# Patient Record
Sex: Female | Born: 1950
Health system: Southern US, Community
[De-identification: ages and names within clinical notes are randomized; demographics above are authoritative.]

## PROBLEM LIST (undated history)

## (undated) DIAGNOSIS — K8020 Calculus of gallbladder without cholecystitis without obstruction: Secondary | ICD-10-CM

## (undated) DIAGNOSIS — I10 Essential (primary) hypertension: Secondary | ICD-10-CM

## (undated) DIAGNOSIS — Z79890 Hormone replacement therapy (postmenopausal): Secondary | ICD-10-CM

## (undated) DIAGNOSIS — E785 Hyperlipidemia, unspecified: Secondary | ICD-10-CM

## (undated) DIAGNOSIS — K635 Polyp of colon: Secondary | ICD-10-CM

## (undated) DIAGNOSIS — K219 Gastro-esophageal reflux disease without esophagitis: Secondary | ICD-10-CM

## (undated) HISTORY — DX: Calculus of gallbladder without cholecystitis without obstruction: K80.20

## (undated) HISTORY — DX: Gastro-esophageal reflux disease without esophagitis: K21.9

## (undated) HISTORY — DX: Hormone replacement therapy: Z79.890

## (undated) HISTORY — DX: Essential (primary) hypertension: I10

## (undated) HISTORY — PX: TOTAL ABDOMINAL HYSTERECTOMY: SHX209

## (undated) HISTORY — PX: TUBAL LIGATION: SHX77

## (undated) HISTORY — PX: SHOULDER ARTHROSCOPY: SHX128

## (undated) HISTORY — DX: Hyperlipidemia, unspecified: E78.5

## (undated) HISTORY — PX: FRACTURE SURGERY: SHX138

## (undated) HISTORY — PX: TONSILLECTOMY: SUR1361

## (undated) HISTORY — DX: Polyp of colon: K63.5

---

## 1999-12-23 ENCOUNTER — Encounter: Admission: RE | Admit: 1999-12-23 | Discharge: 1999-12-23 | Payer: Self-pay | Admitting: Obstetrics and Gynecology

## 1999-12-23 ENCOUNTER — Encounter: Payer: Self-pay | Admitting: Obstetrics and Gynecology

## 2000-12-27 ENCOUNTER — Encounter: Payer: Self-pay | Admitting: Obstetrics and Gynecology

## 2000-12-27 ENCOUNTER — Encounter: Admission: RE | Admit: 2000-12-27 | Discharge: 2000-12-27 | Payer: Self-pay | Admitting: Obstetrics and Gynecology

## 2001-12-28 ENCOUNTER — Encounter: Admission: RE | Admit: 2001-12-28 | Discharge: 2001-12-28 | Payer: Self-pay | Admitting: Obstetrics and Gynecology

## 2001-12-28 ENCOUNTER — Encounter: Payer: Self-pay | Admitting: Obstetrics and Gynecology

## 2002-05-03 ENCOUNTER — Ambulatory Visit (HOSPITAL_BASED_OUTPATIENT_CLINIC_OR_DEPARTMENT_OTHER): Admission: RE | Admit: 2002-05-03 | Discharge: 2002-05-03 | Payer: Self-pay

## 2002-09-18 ENCOUNTER — Encounter: Payer: Self-pay | Admitting: Gastroenterology

## 2002-09-18 ENCOUNTER — Encounter: Admission: RE | Admit: 2002-09-18 | Discharge: 2002-09-18 | Payer: Self-pay | Admitting: Gastroenterology

## 2002-10-19 ENCOUNTER — Ambulatory Visit (HOSPITAL_COMMUNITY): Admission: RE | Admit: 2002-10-19 | Discharge: 2002-10-19 | Payer: Self-pay | Admitting: Gastroenterology

## 2002-10-19 ENCOUNTER — Encounter (INDEPENDENT_AMBULATORY_CARE_PROVIDER_SITE_OTHER): Payer: Self-pay | Admitting: Specialist

## 2002-12-31 ENCOUNTER — Encounter: Payer: Self-pay | Admitting: Obstetrics and Gynecology

## 2002-12-31 ENCOUNTER — Encounter: Admission: RE | Admit: 2002-12-31 | Discharge: 2002-12-31 | Payer: Self-pay | Admitting: Obstetrics and Gynecology

## 2004-01-10 ENCOUNTER — Encounter: Admission: RE | Admit: 2004-01-10 | Discharge: 2004-01-10 | Payer: Self-pay | Admitting: Obstetrics and Gynecology

## 2005-02-10 ENCOUNTER — Encounter: Admission: RE | Admit: 2005-02-10 | Discharge: 2005-02-10 | Payer: Self-pay | Admitting: Obstetrics and Gynecology

## 2006-02-14 ENCOUNTER — Encounter: Admission: RE | Admit: 2006-02-14 | Discharge: 2006-02-14 | Payer: Self-pay | Admitting: Obstetrics and Gynecology

## 2006-08-11 ENCOUNTER — Inpatient Hospital Stay (HOSPITAL_COMMUNITY): Admission: EM | Admit: 2006-08-11 | Discharge: 2006-08-14 | Payer: Self-pay | Admitting: Emergency Medicine

## 2007-05-25 HISTORY — PX: TIBIA FRACTURE SURGERY: SHX806

## 2007-11-29 ENCOUNTER — Ambulatory Visit: Payer: Self-pay | Admitting: Cardiology

## 2007-12-07 ENCOUNTER — Ambulatory Visit: Payer: Self-pay

## 2007-12-07 ENCOUNTER — Encounter: Payer: Self-pay | Admitting: Cardiology

## 2008-12-04 ENCOUNTER — Other Ambulatory Visit: Admission: RE | Admit: 2008-12-04 | Discharge: 2008-12-04 | Payer: Self-pay | Admitting: Obstetrics and Gynecology

## 2009-10-01 ENCOUNTER — Telehealth (INDEPENDENT_AMBULATORY_CARE_PROVIDER_SITE_OTHER): Payer: Self-pay | Admitting: *Deleted

## 2010-06-23 NOTE — Progress Notes (Signed)
  Recieved ROI Via Fax from pt, Faxed LOV,Stress,Echo.[email or web address] lead over to Dr.Nasher office  [facility]  Oct 01, 2009 9:28 AM

## 2010-10-06 NOTE — Assessment & Plan Note (Signed)
[facility]                            CARDIOLOGY OFFICE NOTE   [name], [name]                    MRN:          [id number]  DATE:11/29/2007                            DOB:          [birth year]    HISTORY:  [name] is a pleasant 60 year old female who I am asked  to evaluate for an abnormal echocardiogram and chest pain.  She has no  prior cardiac history.  Over the past 1 year she has had occasional  chest tightness and is described as a choking feeling.  It is in the  substernal/upper chest area.  It does not radiate.  It is not pleuritic,  positional, nor it is related to food.  It is not exertional.  There is  no associated nausea, vomiting, shortness of breath, or diaphoresis.  It  lasts typically several minutes although it has lasted up to an hour in  the past.  There is no relieving factors.  Of note, over the past 2  weeks this symptoms have almost been continuous.  Because of the above  we were asked to further evaluate.  There was also question of an  abnormal electrocardiogram at Dr. Lavon Paganini office.   PRESENT MEDICATIONS:  1. Fosteum daily.  2. Estradiol 0.5 mg p.o. b.i.d.  3. Multivitamin.  4. Glucosamine/chondroitin.  5. Caltrate.  6. Grape seed.   ALLERGIES:  She has no known drug allergies.   SOCIAL HISTORY:  She does not smoke, only rarely consumes alcohol.   FAMILY HISTORY:  Negative for coronary artery disease or sudden death.   PAST MEDICAL HISTORY:  Significant for hyperlipidemia, but there is no  diabetes mellitus or hypertension.  There is no other medical problems.  She has had a prior hysterectomy.  She fractured her left knee  approximately 1 year ago.  She has also had surgery on the rotator cuff  on the left.   REVIEW OF SYSTEMS:  She denies any headaches, fevers, or chills.  There  is no productive cough or hemoptysis.  There is no dysphagia,  odynophagia, melena, or hematochezia.  There is no  dysuria or hematuria.  There is no rash or seizure activity.  There is no orthopnea, PND, or  pedal edema.  Remaining systems are negative.   PHYSICAL EXAMINATION:  VITAL SIGNS:  Today shows a blood pressure of  128/83 and a pulse of 76.  She weighs 120 pounds.  GENERAL:  She is well developed, well nourished, in no acute distress.  She does not appear to be depressed.  There is no peripheral clubbing.  SKIN:  Warm and dry.  BACK:  Normal.  HEENT:  Normal.  Normal eyelids.  NECK:  Supple with a normal upstroke bilaterally.  No bruits heard.  There is no jugular venous distention, and I cannot appreciate  thyromegaly.  CHEST:  Clear to auscultation, normal expansion.  CARDIOVASCULAR:  Regular rate and rhythm.  Normal S1 and S2.  There are  no murmurs, rubs, or gallops noted.  ABDOMEN:  Nontender, distended.  Positive bowel sounds.  No  hepatosplenomegaly, no mass appreciated.  There  is no abdominal bruit.  EXTREMITIES:  She has 2+ femoral pulses bilaterally.  No bruits.  No  edema, palpate no cords.  She has 2+ posterior tibial pulses  bilaterally.  NEUROLOGIC:  Grossly intact.   Her electrocardiogram here shows a sinus rhythm at a rate of 62.  The  axis is normal.  There are no ST changes noted.  Note, I do have an  electrocardiogram from Dr. Lavon Paganini office dated November 21, 2007.  This  shows a sinus rhythm.  There is poor R-wave progression and prior septal  infarct cannot be excluded, although I think there is a component of  lead placement on this particular tracing.  Note the patient also had  laboratories drawn in Dr. Lavon Paganini office that showed an LDL of 156 and  an HDL of 67.   DIAGNOSES:  1. Atypical chest pain - [name]'s symptoms are somewhat      atypical and her electrocardiogram is normal.  We will plan to      proceed with the stress echocardiogram.  If it is normal, we will      not proceed further ischemia evaluation.  2. Abnormal electrocardiogram - I  have reviewed the electrocardiogram      from Dr. Lavon Paganini office and some of this appears to be related to      lead placement.  Her electrocardiogram here is normal.  We will      plan to do a stress echocardiogram described in #1.  3. Hyperlipidemia - She will need to follow a low-cholesterol diet and      follow up with Dr. Elana Alm concerning this issue.  4. History of knee fracture - This occurred approximately 1 year ago.      I considered pulmonary embolus as the cause of her symptoms.      However, her pain is not pleuritic and had been intermittent for      the past year.  I, therefore, doubt pulmonary embolus and will not      pursue this further.  We will see her back on an as-needed basis,      pending results of stress echocardiogram.     Madolyn Frieze. Jens Som, MD, [facility]  Electronically Signed    BSC/MedQ  DD: 11/29/2007  DT: 11/30/2007  Job #: [id number]   cc:   S. Kyra Manges, M.D.

## 2010-10-09 NOTE — Op Note (Signed)
   NAME:  [name], [name]                       ACCOUNT NO.:  [id number]   MEDICAL RECORD NO.:  [id number]                   PATIENT TYPE:  AMB   LOCATION:  ENDO                                 FACILITY:  [facility]   PHYSICIAN:  Anselmo Rod, M.D.               DATE OF BIRTH:  [birth year]   DATE OF PROCEDURE:  10/19/2002  DATE OF DISCHARGE:                                 OPERATIVE REPORT   PROCEDURE PERFORMED:  Colonoscopy with snare polypectomy x1.   ENDOSCOPIST:  Anselmo Rod, M.D.   INSTRUMENT USED:  Olympus videocolonoscope.   INDICATION FOR THE PROCEDURE:  A 60 year old white female with a history of  acute bleeding and change in bowel habits.  Rule out colonic polyps, masses,  hemorrhoids, etc.   PREPROCEDURE PREPARATION:  Informed consent was procured from the patient.  The patient had fasted for eight hours prior to the procedure and prepped  with a bottle of magnesium citrate and a gallon on GoLYTELY the night prior  to the procedure.   PREPROCEDURE PHYSICAL:  VITAL SIGNS:  Stable.  NECK:  Supple.  CHEST:  Clear to auscultation.  S1 and S2 regular.  ABDOMEN:  Soft with normal bowel sounds.   DESCRIPTION OF THE PROCEDURE:  The patient was placed in the left lateral  decubitus position and sedated with 6 mg of Demerol and  6 mg Versed  intravenously.  Once the patient was adequately sedated and maintained on  low-flow oxygen and continuous cardiac monitoring, the Olympus  videocolonoscope was advanced from the rectum to the cecum without  difficulty.  A small sessile polyp was snared from the proximal right colon.  The appendiceal orifice and ileocecal valve were clearly visualized.  The  terminal ileum appeared normal as well.  Retroflexion in the rectum revealed  small internal hemorrhoids.  No other masses or polyps were seen.  The  patient tolerated the procedure well without complications.   IMPRESSION:  1. Small nonbleeding internal hemorrhoids.  2. Small  sessile polyp snared from the proximal right colon.  3. Otherwise normal colonoscopy to the terminal ileum.   RECOMMENDATIONS:  1. Await pathology results.  2.     Avoid all nonsteroidals including aspirin for now.  3. High fiber diet with liberal fluid intake.  4. Outpatient followup in the next two weeks for further recommendations.                                               Anselmo Rod, M.D.    JNM/MEDQ  D:  10/19/2002  T:  10/20/2002  Job:  [id number]   cc:   Teena Irani. Arlyce Dice, M.D.  [address]  [place]  [place] [address]  Fax: [phone]

## 2010-10-09 NOTE — Op Note (Signed)
NAMEAMBIKA, ZETTLEMOYER             ACCOUNT NO.:  [id number]   MEDICAL RECORD NO.:  [id number]          PATIENT TYPE:  INP   LOCATION:  5030                         FACILITY:  [facility]   PHYSICIAN:  Doralee Albino. Carola Frost, M.D. DATE OF BIRTH:  1951/04/26   DATE OF PROCEDURE:  08/12/2006  DATE OF DISCHARGE:                               OPERATIVE REPORT   PREOPERATIVE DIAGNOSIS:  Left bicondylar tibial plateau and eminence  fracture.   POSTOPERATIVE DIAGNOSES:  1. Left bicondylar tibial plateau and eminence fracture.  2. Lateral meniscus detachment.   PROCEDURES:  1. Open reduction and internal fixation of left bicondylar tibial      plateau.  2. Repair of tibial eminence fracture.  3. Repair of lateral meniscus through an arthrotomy.  4. Anterior compartment fasciotomy.   SURGEON:  Myrene Galas, M.D.   ASSISTANT:  None.   ANESTHESIA:  General, Dr. Katrinka Blazing.   SPECIMENS:  None.   DRAINS:  One   COMPLICATIONS:  None.   TOURNIQUET:  None.   DISPOSITION:  PACU.   CONDITION:  Stable.   BRIEF SUMMARY AND INDICATIONS FOR PROCEDURE:  Normal Mans is a 60-  year-old female who sustained a severe left tibial plateau fracture in a  ground-level fall down some steps.  She did not have any sensory or  motor disturbances distally.  Underwent a period of spine mobilization  and compression, ice and elevation.  Soft tissues were rechecked and  found to wrinkle quite easily without excessive swelling and  consequently was deemed stable for internal fixation.  We discussed at  length with the patient and her family the risk of infection, nerve  injury, vessel injury, compartment syndrome, subsequent need for  fasciotomy, malunion, nonunion, arthritis, decreased range of motion,  instability and others.  Also we discussed DVT, PE  and after full  discussion of these risks and others, she wished to proceed.   BRIEF DESCRIPTION OF PROCEDURE:  Ms. Esther was taken to the  operating room  after administration of preop antibiotics.  Her left  lower extremity was prepped and draped in usual sterile fashion.  Tourniquet was placed about the leg but never inflated during the case.  We made a classic AO type approach with a curvilinear incision over  Gerty's tubercle.  Dissection was carried carefully down to the anterior  fascia which was split. A cuff of tissue was left over the lateral  tibial plateau.  We also incised the retinaculum proximal to the joint  and then performed an arthrotomy by incising along the base of the  coronary ligament.  As we reflected the retinaculum, complete detachment  of the lateral meniscus was visualized.  It was retracted medially.  Also impaction of the tibial plateau was visualized.  These were in  large segments fortunately and the cartilage itself appeared well-  preserved with no underlying significant arthritis.  We then applied a  series of Prolene sutures from posterior around the midbody to the  anterior portion of the meniscus in order to repair it in vertical  mattress fashion, but we did not repair it at that time  so that we could  have full visualization of the joint.  We then began to elevate the  articular surface using a series of tamps.  Once this was complete, we  placed multiple K-wires across the surface and then made a small stab  incision on the medial side for use of the sharp/sharp tenaculum to  correct some slight posterior shear of the medial block of the articular  surface.  After correction we then fixed the appropriate size 3-5  lateral plate and used the King tong clamp to achieve compression across  the articular surface.  We checked our plate position on AP and lateral  images and then placed a series of rafter screws and standard screws  distally to affix the plate to the bone followed by further lock  fixation.  The metaphyseal defect was then filled with 5 mL of Norian  cement.  Her bone quality was marginal.   The meniscus was then repaired  again using a free needle with a vertical mattress technique,  reattaching it to the capsule.  The retinaculum was repaired with 1-0  Vicryl.   Final AP and lateral images showed appropriate articular reduction,  overall alignment and hardware placement without any apparent  complication.  We did take the long soft tissue scissors and create a  path both on the superficial and deep aspect of the anterior compartment  fascia and then performed a prophylactic fasciotomy underneath the skin  all the way down to the ankle.  No bleeding of any kind was noted with  this and the scissors tip was turned medial away from the superficial  peroneal nerve.  The anterior compartment then had a medium Hemovac  drain placed and a layered closure without reapproximating the anterior  compartment was performed.  We did place a corner stitch from that  fascia to prevent retraction and we did try to close over the plate  proximally with the deep layer, but again the anterior compartment was  left open to allow for swelling.  Vicryl 2-0 and nylon was used for the  skin.  Sterile gently compressive dressing and the knee immobilizer was  applied.  The patient was taken to PACU in stable condition.   PROGNOSIS:  Ms. Shearer should have a good outcome from this fracture  if we can avoid complications in the perioperative period as she appears  to have appropriately restore alignment.  Her stability as assessed in  30 degrees of flexion and full extension was quite good and as her of  meniscus has then repaired also her articular congruity appears  restored.  She will be on Lovenox for DVT prophylaxis and will be non-  weightbearing for the next 8 weeks or so with graduated weightbearing  perhaps beginning around 6 weeks.      Doralee Albino. Carola Frost, M.D.  Electronically Signed     MHH/MEDQ  D:  08/12/2006  T:  08/12/2006  Job:  [id number]

## 2010-10-09 NOTE — Discharge Summary (Signed)
NAMESHAWNISE, PETERKIN             ACCOUNT NO.:  [id number]   MEDICAL RECORD NO.:  [id number]          PATIENT TYPE:  INP   LOCATION:  5030                         FACILITY:  [facility]   PHYSICIAN:  Doralee Albino. Carola Frost, M.D. DATE OF BIRTH:  06/17/1950   DATE OF ADMISSION:  08/10/2006  DATE OF DISCHARGE:  08/14/2006                               DISCHARGE SUMMARY   DISCHARGE DIAGNOSES:  1. Left bicondylar tibial plateau fracture.  2. Left unilateral meniscus fracture.  3. Tibial laminar fracture.   PROCEDURE PERFORMED:  Oct 12, 2006, ORIF of left bicondylar tibial  plateau, eminence, repair of lemniscus, anterior compartment fasciotomy.   BRIEF SUMMARY OF HOSPITAL COURSE:  Ms. Ethal Gotay is a 60 year old  female who was admitted for pain control, elevation and ice to the left  knee after sustaining a severe bicondylar tibial plateau fracture.  Fortunately, her swelling was able to adequately resolve to allow for  early internal fixation.  The patient was too uncomfortable  preoperatively to mobilize with physical therapy.  Her surgery went very  well and without complication with the procedures listed above.  Postoperatively, she had her drain removed on postop day 2.  At that  time, she was converted into a hinge brace.  She was able to again  mobilize with physical therapy, nonweight bearing on the operative  extremity.  Her wound was clean, dry and intact at the time of the  discharge.   DISCHARGE INSTRUCTIONS:  Ms. Strub is to remain on nonweight bearing  on left leg with full unrestricted range of motion.  Her dressing should  be changed daily with Adaptic gauze and ACE wraps.   DISCHARGE MEDICATIONS:  1. Percocet 5/325 one to two tablets every 4-6 hours as needed.  2. Oxycodone 5 mg every 2-3 hours as needed for breakthrough pain.  3. Lovenox 40 mg 1 subcu daily for the next 2 weeks.  4. Phenergan 25 mg q.6 p.r.n..  5. Robaxin 500 mg p.o. q.6 p.r.n.  6. Estradiol 0.5  mg b.i.d.  7. Terbinafine HCL daily.   FOLLOWUP APPOINTMENT:  Patient is to return to see Dr. Carola Frost in 10-14  days and to contact him sooner if any problems, concerns or questions.      Doralee Albino. Carola Frost, M.D.  Electronically Signed     MHH/MEDQ  D:  11/02/2006  T:  11/02/2006  Job:  [id number]

## 2010-10-09 NOTE — Op Note (Signed)
   NAME:  [name], [name]                       ACCOUNT NO.:  [id number]   MEDICAL RECORD NO.:  [id number]                   PATIENT TYPE:  AMB   LOCATION:  NESC                                 [facility]ACILITY:  [facility]   PHYSICIAN:  Katherine Roan, M.D.               DATE OF BIRTH:  1950/11/15   DATE OF PROCEDURE:  05/03/2002  DATE OF DISCHARGE:                                 OPERATIVE REPORT   PREOPERATIVE DIAGNOSES:  Left Bartholin's gland cyst.   POSTOPERATIVE DIAGNOSES:  Left Bartholin's gland cyst.   OPERATION:  Marsupialization of left Bartholin's gland cyst.   DESCRIPTION OF PROCEDURE:  The patient was placed in lithotomy position,  prepped and draped in the usual fashion. The Bartholin's gland was  identified. The bladder was emptied. The area on the left side was grasped  with small right angled retractors and the Bartholin's gland cyst was  dissected from the underlying vagina. The area was opened and the cyst wall  was then sutured to the vaginal mucosa with a locking suture of 3-0 Vicryl.  Hemostasis was secured and used a Bovie and suture to obtain hemostasis. I  then infiltrated the gland with about 10-15 cc of 0.5% Marcaine with  epinephrine. [name] tolerated this procedure well and was sent to the  recovery room in good condition.                                               Katherine Roan, M.D.    SDM/MEDQ  D:  05/03/2002  T:  05/03/2002  Job:  [id number]

## 2010-10-09 NOTE — Consult Note (Signed)
NAMEANGELENA, [name]             ACCOUNT NO.:  [id number]   MEDICAL RECORD NO.:  [id number]          PATIENT TYPE:  INP   LOCATION:  1823                         FACILITY:  [facility]   PHYSICIAN:  Doralee Albino. Carola Frost, M.D. DATE OF BIRTH:  11-12-1950   DATE OF CONSULTATION:  08/10/2006  DATE OF DISCHARGE:                                 CONSULTATION   REQUESTING PHYSICIAN:  Mancel Bale, M.D.   REASON FOR CONSULTATION:  Left knee pain and deformity.   BRIEF HISTORY OF PRESENTATION:  [name] is a 60 year old white  female who slipped and fell down 5 steps while going out to the hospital  parking lot this evening.  She denied loss consciousness, denied rib  fractures, but reported severe pain and inability to bear weight on left  knee with obvious swelling.   PAST MEDICAL HISTORY:  Notable for rib fractures.   PAST SURGICAL HISTORY:  Frozen shoulder on the left and total abdominal  hysterectomy.   MEDICATIONS:  1. Calcium.  2. Estrogen.  3. An osteoporosis medication for bone fragility.  4. Stool softeners.   SOCIAL HISTORY:  None.   ALLERGIES:  No known drug allergies.   FAMILY MEDICAL HISTORY:  Notable for osteoporosis.  No significant  contributory factors otherwise.   REVIEW OF SYSTEMS:  Again notable for osteoporosis and constipation.  Otherwise negative.   PHYSICAL EXAMINATION:  The patient was appropriate for stated age,  clearly in some pain but most significantly was having severe nausea.  She had nausea and emesis.  Vital signs are stable.  Examination of the upper extremities notable for the absence of rib  tenderness.  HEART:  With a regular rate and rhythm.  No stridor or difficulty with breathing or wheezing.  ABDOMEN:  Soft, nontender, nondistended.  PELVIS:  Stable. Nontender.  Hips nontender.  Full range of motion on the right of the hip, knee and  ankle.  No focal tenderness, ecchymosis, or instability.  No diminished  strength.  Intact deep  peroneal, superficial peroneal and tibial nerve  sensory and motor function.  Two plus dorsalis pedis and posterior  tibial pulses.  On the left, no tenderness about the hip.  The knee is  in a flexed position.  There is moderate swelling only.  There is an  effusion present.  Distally, there is no tenderness about the ankle, no  ecchymosis, no swelling, no instability.  Intact sensory and motor  function of the deep peroneal, superficial peroneal and tibial nerve  distribution.  Unable to range the knee secondary to pain.  Dorsalis  pedis pulse 2+.   X-RAYS:  AP and lateral knee films demonstrate a severely comminuted  bicondylar tibial plateau fracture with extension down to the shaft.  CT  scan not yet obtained.   ASSESSMENT:  1. Bicondylar left tibial plateau fracture.  2. Severe nausea.   PLAN:  I recommended admission for pain and nausea control.  I have  placed her into a well-padded bulky Jones-type dressing and knee  immobilizer for a splint.  We will obtain a CT scan with reconstructions  for preoperative planning.  I will recheck her soft tissues to tomorrow  or on Friday morning to see if perhaps she has undergone enough soft  tissue swelling resolution that she can safely undergo surgery.  I have  discussed with the patient and her family and friends the risk of the  operating too soon.  We will obtain a CT scan, make further plans based  upon those findings.      Doralee Albino. Carola Frost, M.D.  Electronically Signed     MHH/MEDQ  D:  08/11/2006  T:  08/11/2006  Job:  [id number]

## 2016-03-25 ENCOUNTER — Encounter: Payer: Self-pay | Admitting: Family Medicine

## 2016-09-14 ENCOUNTER — Encounter: Payer: Self-pay | Admitting: Family Medicine

## 2016-09-14 NOTE — Progress Notes (Signed)
   Subjective:    Patient ID: [name], female    DOB: 1951-03-31, 66 y.o.   MRN: [id number]  HPI 66 y/o female presents to establish care.   Reviewed New Patient Health History and updated EPIC as appropriate. Previous PCP is Dr. Marlyn Corporal with [facility]  Acute Concerns Allergies - nasal drainage, cough, congestion, has attempted Flonase which helped her symptoms in the past  PMH Osteopenia - left tibia fracture 9 years ago (fell down steps, has steel rod in leg).  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  Respiratory: Negative for choking and shortness of breath.   Cardiovascular: Negative for chest pain.  Gastrointestinal: Negative for diarrhea, nausea and vomiting.       Objective:   Physical Exam BP (!) 142/68   Pulse 65   Temp 98.3 F (36.8 C) (Oral)   Ht 5' 3.5" (1.613 m)   Wt 136 lb 3.2 oz (61.8 kg)   SpO2 97%   BMI 23.75 kg/m   Gen: pleasant female, NAD HEENT: normocephalic, PERRL, EOMI, no scleral icterus, nasal septum midline, no rhinorrhea, bilateral TM's pearly grey, MMM, uvala midline, neck supple, no adenopathy, no thyromegaly Cardiac: RRR, S1 and S2 present, no murmur Resp: CTAB, normal effort Abd: small scars from previous hysterectomy, soft, no tenderness, normal bowel sounds Ext: no edema Skin: 3 mm by 4 mm erythematous macule of left dorsal hand, no scaling        Assessment & Plan:  Preventative health care 65 y/o female presents to establish care.  - up to date on mammogram - check HIV and Hep C - unclear when last Tdap was (will need to review old records) - previously has had colonoscopy (will send for records) - not a candidate for pap smear due to previous hysterectomy - basic labs ordered (TSH, CBC, CMP, Vit D, and Lipid Profile)  Allergic rhinitis Controlled with prn Flonase  Osteopenia Previously diagnosed at outside facility. Will review old records. -continue Ca and Vit D  Hyperlipidemia Continue Crestor 10 mg three times per week -check lipid profile today  Encounter for screening for HIV Check HIV screen today.   Encounter for hepatitis C screening test for low risk patient Check Hep C screen today.   Neoplasm of uncertain behavior 3 mm by 4 mm lesion of left posterior hand. Not consistent with melanoma. May represent SCC/actinic keratosis.  -patient denied removal/biopsy at this time

## 2016-09-17 ENCOUNTER — Encounter (INDEPENDENT_AMBULATORY_CARE_PROVIDER_SITE_OTHER): Payer: Self-pay

## 2016-09-17 ENCOUNTER — Encounter: Payer: Self-pay | Admitting: Family Medicine

## 2016-09-17 ENCOUNTER — Telehealth: Payer: Self-pay | Admitting: Family Medicine

## 2016-09-17 ENCOUNTER — Ambulatory Visit (INDEPENDENT_AMBULATORY_CARE_PROVIDER_SITE_OTHER): Payer: BLUE CROSS/BLUE SHIELD | Admitting: Family Medicine

## 2016-09-17 DIAGNOSIS — M8580 Other specified disorders of bone density and structure, unspecified site: Secondary | ICD-10-CM

## 2016-09-17 DIAGNOSIS — E785 Hyperlipidemia, unspecified: Secondary | ICD-10-CM | POA: Insufficient documentation

## 2016-09-17 DIAGNOSIS — Z0000 Encounter for general adult medical examination without abnormal findings: Secondary | ICD-10-CM

## 2016-09-17 DIAGNOSIS — Z1159 Encounter for screening for other viral diseases: Secondary | ICD-10-CM | POA: Diagnosis not present

## 2016-09-17 DIAGNOSIS — J301 Allergic rhinitis due to pollen: Secondary | ICD-10-CM | POA: Diagnosis not present

## 2016-09-17 DIAGNOSIS — Z114 Encounter for screening for human immunodeficiency virus [HIV]: Secondary | ICD-10-CM

## 2016-09-17 DIAGNOSIS — J309 Allergic rhinitis, unspecified: Secondary | ICD-10-CM | POA: Insufficient documentation

## 2016-09-17 DIAGNOSIS — D489 Neoplasm of uncertain behavior, unspecified: Secondary | ICD-10-CM

## 2016-09-17 DIAGNOSIS — M810 Age-related osteoporosis without current pathological fracture: Secondary | ICD-10-CM | POA: Insufficient documentation

## 2016-09-17 MED ORDER — MULTIVITAMIN ADULT PO TABS: 1.0000 | ORAL_TABLET | Freq: Every day | ORAL | 1 refills | Status: DC

## 2016-09-17 MED ORDER — ASPIRIN EC 81 MG PO TBEC: 81.0000 mg | DELAYED_RELEASE_TABLET | Freq: Every day | ORAL | 1 refills | Status: DC

## 2016-09-17 MED ORDER — FLUTICASONE PROPIONATE 50 MCG/ACT NA SUSP: 2.0000 | Freq: Every day | NASAL | 6 refills | Status: DC

## 2016-09-17 MED ORDER — GLUCOSAMINE CHONDROITIN COMPLX PO TABS: 1.0000 | ORAL_TABLET | Freq: Every day | ORAL | 1 refills | Status: DC

## 2016-09-17 MED ORDER — CALCIUM CARBONATE 600 MG PO TABS: 600.0000 mg | ORAL_TABLET | Freq: Two times a day (BID) | ORAL | 1 refills | Status: AC

## 2016-09-17 MED ORDER — ROSUVASTATIN CALCIUM 10 MG PO TABS: ORAL_TABLET | ORAL | 3 refills | Status: DC

## 2016-09-17 MED ORDER — VITAMIN D3 50 MCG (2000 UT) PO TABS: 1.0000 | ORAL_TABLET | Freq: Every day | ORAL | 2 refills | Status: AC

## 2016-09-17 NOTE — Telephone Encounter (Signed)
Phone message reviewed and medications updated.

## 2016-09-17 NOTE — Assessment & Plan Note (Signed)
Check HIV screen today.

## 2016-09-17 NOTE — Telephone Encounter (Signed)
Pt called to inform Dr. Ree Kida about the vitamin she is taking. [employer]'s Vitamin D extra strength 2000 IU. Jw

## 2016-09-17 NOTE — Assessment & Plan Note (Signed)
3 mm by 4 mm lesion of left posterior hand. Not consistent with melanoma. May represent SCC/actinic keratosis.  -patient denied removal/biopsy at this time

## 2016-09-17 NOTE — Assessment & Plan Note (Signed)
66 y/o female presents to establish care.  - up to date on mammogram - check HIV and Hep C - unclear when last Tdap was (will need to review old records) - previously has had colonoscopy (will send for records) - not a candidate for pap smear due to previous hysterectomy - basic labs ordered (TSH, CBC, CMP, Vit D, and Lipid Profile)

## 2016-09-17 NOTE — Assessment & Plan Note (Signed)
Check Hep C screen today.

## 2016-09-17 NOTE — Assessment & Plan Note (Signed)
Controlled with prn Flonase

## 2016-09-17 NOTE — Assessment & Plan Note (Signed)
Continue Crestor 10 mg three times per week -check lipid profile today

## 2016-09-17 NOTE — Assessment & Plan Note (Signed)
Previously diagnosed at outside facility. Will review old records. -continue Ca and Vit D

## 2016-09-17 NOTE — Patient Instructions (Signed)
It was nice to see you today.  Dr. Ree Kida will call you with your lab results.   If you would like the lesion on your left hand removed please make an appointment.

## 2016-09-20 LAB — CMP14+EGFR
ALT: 17 IU/L (ref 0–32)
AST: 19 IU/L (ref 0–40)
Albumin/Globulin Ratio: 1.9 (ref 1.2–2.2)
Albumin: 4.4 g/dL (ref 3.6–4.8)
Alkaline Phosphatase: 78 IU/L (ref 39–117)
BUN/Creatinine Ratio: 14 (ref 12–28)
BUN: 11 mg/dL (ref 8–27)
Bilirubin Total: <0.2 mg/dL (ref 0.0–1.2)
CALCIUM: 9.7 mg/dL (ref 8.7–10.3)
CO2: 24 mmol/L (ref 18–29)
CREATININE: 0.77 mg/dL (ref 0.57–1.00)
Chloride: 104 mmol/L (ref 96–106)
GFR, EST AFRICAN AMERICAN: 95 mL/min/{1.73_m2} (ref >59)
GFR, EST NON AFRICAN AMERICAN: 82 mL/min/{1.73_m2} (ref >59)
GLOBULIN, TOTAL: 2.3 g/dL (ref 1.5–4.5)
Glucose: 87 mg/dL (ref 65–99)
Potassium: 4.5 mmol/L (ref 3.5–5.2)
SODIUM: 145 mmol/L — AB (ref 134–144)
TOTAL PROTEIN: 6.7 g/dL (ref 6.0–8.5)

## 2016-09-20 LAB — TSH: TSH: 1.5 u[IU]/mL (ref 0.450–4.500)

## 2016-09-20 LAB — LIPID PANEL
CHOL/HDL RATIO: 3.4 ratio (ref 0.0–4.4)
Cholesterol, Total: 193 mg/dL (ref 100–199)
HDL: 57 mg/dL (ref >39)
LDL CALC: 104 mg/dL — AB (ref 0–99)
TRIGLYCERIDES: 159 mg/dL — AB (ref 0–149)
VLDL Cholesterol Cal: 32 mg/dL (ref 5–40)

## 2016-09-20 LAB — CBC
HEMATOCRIT: 42.5 % (ref 34.0–46.6)
HEMOGLOBIN: 14.2 g/dL (ref 11.1–15.9)
MCH: 28.3 pg (ref 26.6–33.0)
MCHC: 33.4 g/dL (ref 31.5–35.7)
MCV: 85 fL (ref 79–97)
Platelets: 301 10*3/uL (ref 150–379)
RBC: 5.02 x10E6/uL (ref 3.77–5.28)
RDW: 13.2 % (ref 12.3–15.4)
WBC: 6.8 10*3/uL (ref 3.4–10.8)

## 2016-09-20 LAB — HEPATITIS C ANTIBODY

## 2016-09-20 LAB — VITAMIN D 25 HYDROXY (VIT D DEFICIENCY, FRACTURES): Vit D, 25-Hydroxy: 44.1 ng/mL (ref 30.0–100.0)

## 2016-09-20 LAB — HIV ANTIBODY (ROUTINE TESTING W REFLEX): HIV Screen 4th Generation wRfx: NONREACTIVE

## 2016-09-21 ENCOUNTER — Telehealth: Payer: Self-pay | Admitting: Family Medicine

## 2016-09-21 NOTE — Telephone Encounter (Signed)
Called patient to discuss lab results from recent visit. Everything in normal range except mildly elevated triglycerides.

## 2016-10-15 ENCOUNTER — Ambulatory Visit (INDEPENDENT_AMBULATORY_CARE_PROVIDER_SITE_OTHER): Payer: BLUE CROSS/BLUE SHIELD | Admitting: Family Medicine

## 2016-10-15 ENCOUNTER — Encounter: Payer: Self-pay | Admitting: Family Medicine

## 2016-10-15 VITALS — BP 138/76 | HR 60 | Temp 98.1°F | Ht 63.5 in | Wt 135.4 lb

## 2016-10-15 DIAGNOSIS — T7840XA Allergy, unspecified, initial encounter: Secondary | ICD-10-CM

## 2016-10-15 MED ORDER — DIPHENHYDRAMINE HCL 25 MG PO TABS: 25.0000 mg | ORAL_TABLET | Freq: Four times a day (QID) | ORAL | 0 refills | Status: DC | PRN

## 2016-10-15 NOTE — Patient Instructions (Signed)
I suspect you are having a allergic reaction to something you were exposed to during your yardwork yesterday. I have sent a prescription for Benadryl to take every 6hr as needed. Please go to urgent care or the Emergency Department if you notice worsening breathing or wheezing. Please return in 1 week if no improvement or sooner if symptoms worsen.  Dr. Gerlean Ren

## 2016-10-18 NOTE — Progress Notes (Signed)
Subjective:     Patient ID: [name], female   DOB: 1951-01-20, 66 y.o.   MRN: [id number]  HPI Mrs. Siebers is a 66yo female presenting today for facial swelling. Reports she was mowing the yard yesterday [place] After she was through, she noticed increased puffiness around her eyes and behind her ears. Mows frequently, so she believes this may be a reaction to the cattails. Husband also reports he has swelling in his hands after working with the [employer]. Denies wheezing, shortness of breath, fever, nausea, or vomiting. Denies any worsening since yesterday, stating her symptoms have been constant. Has not tried any medication. Nonsmoker.  Review of Systems Per HPI    Objective:   Physical Exam  Constitutional: She appears well-developed and well-nourished. No distress.  HENT:  Head: Normocephalic and atraumatic.  Cardiovascular: Normal rate and regular rhythm.   No murmur heard. Pulmonary/Chest: Effort normal. No respiratory distress. She has no wheezes.  Skin:  Puffiness noted around eyes and cheeks. No rash noted.  Psychiatric: She has a normal mood and affect. Her behavior is normal.       Assessment and Plan:     1. Allergic reaction, initial encounter Suspect secondary to exposure from yardwork yesterday. Recommend against further yardwork today until symptoms improve. Prescription for Benadryl given. Return precautions given. Return in 1 week if no improvement.

## 2016-11-11 ENCOUNTER — Encounter: Payer: Self-pay | Admitting: Family Medicine

## 2016-11-11 ENCOUNTER — Other Ambulatory Visit: Payer: Self-pay | Admitting: *Deleted

## 2016-11-11 DIAGNOSIS — M810 Age-related osteoporosis without current pathological fracture: Secondary | ICD-10-CM

## 2016-11-11 MED ORDER — RISEDRONATE SODIUM 30 MG PO TABS: 30.0000 mg | ORAL_TABLET | ORAL | 11 refills | Status: DC

## 2016-11-11 NOTE — Telephone Encounter (Signed)
Patient states that pharmacy never received actonel this morning.  Resent to pharmacy. Jazmin Hartsell,CMA

## 2016-11-11 NOTE — Progress Notes (Signed)
Reviewed records from previous PCP at [facility]. Updated EPIC as appropriate.   Follows with Dr. Collene Mares (GI).

## 2016-11-11 NOTE — Assessment & Plan Note (Signed)
Patient previously on Actonel for 1-2 years then stopped by previous PCP. Per DEXA in 02/2016 patient does meet criteria for osteoporosis. Will restart Actonel.

## 2016-11-25 ENCOUNTER — Encounter: Payer: Self-pay | Admitting: Family Medicine

## 2017-02-25 ENCOUNTER — Ambulatory Visit (INDEPENDENT_AMBULATORY_CARE_PROVIDER_SITE_OTHER): Payer: BLUE CROSS/BLUE SHIELD | Admitting: *Deleted

## 2017-02-25 ENCOUNTER — Encounter: Payer: Self-pay | Admitting: *Deleted

## 2017-02-25 DIAGNOSIS — Z23 Encounter for immunization: Secondary | ICD-10-CM

## 2017-04-08 ENCOUNTER — Ambulatory Visit: Payer: BLUE CROSS/BLUE SHIELD | Admitting: Family Medicine

## 2017-07-04 ENCOUNTER — Inpatient Hospital Stay (HOSPITAL_COMMUNITY)
Admission: AD | Admit: 2017-07-04 | Discharge: 2017-07-06 | DRG: 419 | Disposition: A | Discharge status: Home / Self Care | Payer: BLUE CROSS/BLUE SHIELD | Source: Ambulatory Visit | Attending: Family Medicine | Admitting: Family Medicine

## 2017-07-04 ENCOUNTER — Encounter (HOSPITAL_COMMUNITY): Payer: Self-pay | Admitting: General Practice

## 2017-07-04 ENCOUNTER — Inpatient Hospital Stay (HOSPITAL_COMMUNITY): Payer: BLUE CROSS/BLUE SHIELD

## 2017-07-04 ENCOUNTER — Other Ambulatory Visit: Payer: Self-pay

## 2017-07-04 ENCOUNTER — Ambulatory Visit: Payer: BLUE CROSS/BLUE SHIELD | Admitting: Family Medicine

## 2017-07-04 ENCOUNTER — Encounter: Payer: Self-pay | Admitting: Family Medicine

## 2017-07-04 VITALS — BP 148/76 | HR 79 | Temp 98.2°F | Ht 63.0 in | Wt 141.8 lb

## 2017-07-04 DIAGNOSIS — Z79899 Other long term (current) drug therapy: Secondary | ICD-10-CM

## 2017-07-04 DIAGNOSIS — Z7951 Long term (current) use of inhaled steroids: Secondary | ICD-10-CM | POA: Diagnosis not present

## 2017-07-04 DIAGNOSIS — E8809 Other disorders of plasma-protein metabolism, not elsewhere classified: Secondary | ICD-10-CM | POA: Diagnosis present

## 2017-07-04 DIAGNOSIS — R1011 Right upper quadrant pain: Secondary | ICD-10-CM

## 2017-07-04 DIAGNOSIS — Z90710 Acquired absence of both cervix and uterus: Secondary | ICD-10-CM | POA: Diagnosis not present

## 2017-07-04 DIAGNOSIS — M25511 Pain in right shoulder: Secondary | ICD-10-CM | POA: Diagnosis present

## 2017-07-04 DIAGNOSIS — E785 Hyperlipidemia, unspecified: Secondary | ICD-10-CM | POA: Diagnosis present

## 2017-07-04 DIAGNOSIS — Z7982 Long term (current) use of aspirin: Secondary | ICD-10-CM | POA: Diagnosis not present

## 2017-07-04 DIAGNOSIS — R12 Heartburn: Secondary | ICD-10-CM | POA: Diagnosis present

## 2017-07-04 DIAGNOSIS — R109 Unspecified abdominal pain: Secondary | ICD-10-CM | POA: Diagnosis present

## 2017-07-04 DIAGNOSIS — Z9119 Patient's noncompliance with other medical treatment and regimen: Secondary | ICD-10-CM

## 2017-07-04 DIAGNOSIS — K810 Acute cholecystitis: Secondary | ICD-10-CM

## 2017-07-04 DIAGNOSIS — K8000 Calculus of gallbladder with acute cholecystitis without obstruction: Secondary | ICD-10-CM | POA: Diagnosis present

## 2017-07-04 LAB — AMYLASE: Amylase: 65 U/L (ref 28–100)

## 2017-07-04 LAB — URINALYSIS, ROUTINE W REFLEX MICROSCOPIC
BACTERIA UA: NONE SEEN
Bilirubin Urine: NEGATIVE
Glucose, UA: NEGATIVE mg/dL
HGB URINE DIPSTICK: NEGATIVE
Ketones, ur: 5 mg/dL — AB
Nitrite: NEGATIVE
PH: 7 (ref 5.0–8.0)
Protein, ur: NEGATIVE mg/dL
SPECIFIC GRAVITY, URINE: 1.012 (ref 1.005–1.030)

## 2017-07-04 LAB — COMPREHENSIVE METABOLIC PANEL
ALBUMIN: 3.8 g/dL (ref 3.5–5.0)
ALK PHOS: 70 U/L (ref 38–126)
ALT: 24 U/L (ref 14–54)
ANION GAP: 11 (ref 5–15)
AST: 23 U/L (ref 15–41)
BUN: 8 mg/dL (ref 6–20)
CALCIUM: 9 mg/dL (ref 8.9–10.3)
CO2: 24 mmol/L (ref 22–32)
Chloride: 105 mmol/L (ref 101–111)
Creatinine, Ser: 0.81 mg/dL (ref 0.44–1.00)
GFR calc Af Amer: >60 mL/min (ref >60)
GFR calc non Af Amer: >60 mL/min (ref >60)
GLUCOSE: 102 mg/dL — AB (ref 65–99)
Potassium: 4 mmol/L (ref 3.5–5.1)
Sodium: 140 mmol/L (ref 135–145)
TOTAL PROTEIN: 6.7 g/dL (ref 6.5–8.1)
Total Bilirubin: 0.8 mg/dL (ref 0.3–1.2)

## 2017-07-04 LAB — LIPASE, BLOOD: LIPASE: 34 U/L (ref 11–51)

## 2017-07-04 LAB — CBC
HCT: 41.8 % (ref 36.0–46.0)
HEMOGLOBIN: 14 g/dL (ref 12.0–15.0)
MCH: 29.2 pg (ref 26.0–34.0)
MCHC: 33.5 g/dL (ref 30.0–36.0)
MCV: 87.1 fL (ref 78.0–100.0)
Platelets: 288 10*3/uL (ref 150–400)
RBC: 4.8 MIL/uL (ref 3.87–5.11)
RDW: 13.4 % (ref 11.5–15.5)
WBC: 12.4 10*3/uL — ABNORMAL HIGH (ref 4.0–10.5)

## 2017-07-04 MED ORDER — SODIUM CHLORIDE 0.9 % IV SOLN
INTRAVENOUS | Status: DC
  Administered 2017-07-04 – 2017-07-05 (×3): via INTRAVENOUS

## 2017-07-04 MED ORDER — ACETAMINOPHEN 325 MG PO TABS
650.0000 mg | ORAL_TABLET | Freq: Four times a day (QID) | ORAL | Status: DC | PRN
  Administered 2017-07-04: 650 mg via ORAL
  Filled 2017-07-04: qty 2

## 2017-07-04 MED ORDER — PIPERACILLIN-TAZOBACTAM 3.375 G IVPB
3.3750 g | Freq: Three times a day (TID) | INTRAVENOUS | Status: DC
  Administered 2017-07-04 – 2017-07-06 (×5): 3.375 g via INTRAVENOUS
  Filled 2017-07-04 (×6): qty 50

## 2017-07-04 MED ORDER — ASPIRIN EC 81 MG PO TBEC
81.0000 mg | DELAYED_RELEASE_TABLET | Freq: Every day | ORAL | Status: DC
  Administered 2017-07-06: 81 mg via ORAL
  Filled 2017-07-04: qty 1

## 2017-07-04 MED ORDER — ONDANSETRON HCL 4 MG PO TABS: 4.0000 mg | ORAL_TABLET | Freq: Four times a day (QID) | ORAL | Status: DC | PRN

## 2017-07-04 MED ORDER — ONDANSETRON HCL 4 MG/2ML IJ SOLN: 4.0000 mg | Freq: Four times a day (QID) | INTRAMUSCULAR | Status: DC | PRN

## 2017-07-04 MED ORDER — POLYETHYLENE GLYCOL 3350 17 G PO PACK: 17.0000 g | PACK | Freq: Every day | ORAL | Status: DC | PRN

## 2017-07-04 MED ORDER — ACETAMINOPHEN 325 MG PO TABS: 650.0000 mg | ORAL_TABLET | Freq: Four times a day (QID) | ORAL | Status: DC | PRN

## 2017-07-04 MED ORDER — ENOXAPARIN SODIUM 40 MG/0.4ML ~~LOC~~ SOLN
40.0000 mg | SUBCUTANEOUS | Status: DC
  Administered 2017-07-04 – 2017-07-05 (×2): 40 mg via SUBCUTANEOUS
  Filled 2017-07-04 (×2): qty 0.4

## 2017-07-04 MED ORDER — OXYCODONE HCL 5 MG PO TABS: 5.0000 mg | ORAL_TABLET | ORAL | Status: DC | PRN

## 2017-07-04 NOTE — Progress Notes (Signed)
   CC: same day abdominal pain  HPI Per chart review: abd [place] complete 2004 after nausea and vomiting and pain. Patient doesn't recall this.   This episode: January 6th, sudden onset sharp pain across all upper abdominal quadrants resolved with tylenol. Occurred at [place], dinner at 6pm. She and her husband thought this was pleurisy and dismissed it since it resolved. However, it recurred this Saturday.again, sharp pain across all upper quadrants. Pain first them vomiting whole food, no blood. 3x vomiting. On ruq and rlq too. Still hasgallbladder, no sick contacts, no fevers. Normal BMs. Present every day since Saturday, hasn't eaten at all today. Becoming constant. No appetite. Also endorses some feelings of GERD over the past few days, which she normally doesn't have.   surg hx significant for laparoscopy hyst.   I have met this patient before, and she seems pale and in severe pain today.   ROS: denies CP    CC, SH/smoking status, and VS noted  Objective: BP (!) 148/76 (BP Location: Left Arm, Patient Position: Sitting, Cuff Size: Normal)   Pulse 79   Temp 98.2 F (36.8 C) (Oral)   Ht '5\' 3"'$  (1.6 m)   Wt 141 lb 12.8 oz (64.3 kg)   SpO2 98%   BMI 25.12 kg/m  Gen: pale, thin female grimacing in pain HEENT: NCAT, EOMI, PERRL CV: RRR, no murmur Resp: CTAB, no wheezes, non-labored Abd: soft, nondistended. Exquisitely tender across all upper quadrants, +murphy's sign.  Ext: No edema, warm Neuro: Alert and oriented, Speech clear, No gross deficits  Assessment and plan:  Severe abdominal pain: highly suspicious for gallbladder pathology given imaging hx and evolving presentation with murphy's sign +. Will plan for direct admission for imaging and surgical eval. Bed available. Since she has been NPO all day, I would proceed with [place] today and call surgery with results. In the meantime, IV zosyn to cover for enteric microbes. Would recommend initial pain control with tylenol and PRN oxycodone  as needed. Additional possible etiologies include gastric or duodenal ulcer, pancreatitis, pyelonephritis (although afebrile and no urinary symptoms). Would check CBC, CMP, lipase, amylase, UA.    Ralene Ok, MD, PGY2 07/04/2017 3:00 PM

## 2017-07-04 NOTE — Patient Instructions (Signed)
Patient sent for direct admission.

## 2017-07-04 NOTE — H&P (Signed)
[facility] Admission History and Physical Service Pager: [phone]  Patient name: [name] Medical record number: [id number] Date of birth: 1950-10-03 Age: 66 y.o. Gender: female  Primary Care Provider: Alveda Reasons, MD Consultants: Surgery Code Status: Full  Chief Complaint: abdominal pain  Assessment and Plan: [name] is a 67 y.o. female presenting with abdominal pain. PMH is significant for hyperlipidemia not on medications.  Abdominal pain 2/2 acute cholecystitis. RUQ abdominal pain with positive Murphy sign. Abd US showing gallstone with gallbladder wall thickening consistent with acute cholecystitis. Patient is well appearing, stable vital signs and elevated WBC 12.4 without concern for sepsis. - admit to med surg, attending Dr. Ardelia Mems - vitals per floor - NPO at midnight - IV zosyn (2/11-) - consult surgery, appreciate recommendations - MIVF NS@100cc /hr - prn tylenol for mild pain - prn oxy 5mg  q4prn for breakthrough - prn zofran for nausea  H/o hyperlipidemia. Patient noncompliant on home crestor. Last LDL 104 on 09/17/16 - continue home ASA81 - recheck lipid panel   FEN/GI: clear liquids with NPO at midnight Prophylaxis: lovenox  Disposition: admit to med surg  History of Present Illness:  [name] is a 67 y.o. female presenting with abdominal pain.  Had sudden onset RUQ abdominal pain on Jan 6th that woke her up from sleep but self resolved. Started again on Saturday, sudden onset but over both RUQ and LUQ, radiating to RLQ and R sided of her back. Had associated 3 episodes of NBNB vomiting. No fever/chills. No diarrhea or constipation. Has been taking ibuprofen and tylenol which bring some relief. Has had some heartburn over the last couple of months that she has been managing with diet modifications.  Review Of Systems: Per HPI with the following additions:   Review of Systems  Constitutional:  Negative for chills, diaphoresis and fever.  HENT: Negative for congestion and sore throat.   Respiratory: Negative for cough, shortness of breath and wheezing.   Cardiovascular: Negative for chest pain, palpitations and leg swelling.  Gastrointestinal: Positive for abdominal pain (RUQ and LUQ), heartburn, nausea and vomiting. Negative for blood in stool, constipation, diarrhea and melena.  Genitourinary: Negative for dysuria, flank pain, frequency, hematuria and urgency.  Musculoskeletal: Negative for myalgias.  Skin: Negative for itching and rash.  Neurological: Negative for dizziness, focal weakness and weakness.  Endo/Heme/Allergies: Does not bruise/bleed easily.    Patient Active Problem List   Diagnosis Date Noted  . Abdominal pain, RUQ 07/04/2017  . Encounter for screening for HIV 09/17/2016  . Encounter for hepatitis C screening test for low risk patient 09/17/2016  . Preventative health care 09/17/2016  . Allergic rhinitis 09/17/2016  . Osteoporosis 09/17/2016  . Hyperlipidemia 09/17/2016  . Neoplasm of uncertain behavior 26/71/2458    Past Medical History: Past Medical History:  Diagnosis Date  . Postmenopausal hormone replacement therapy     Past Surgical History: Past Surgical History:  Procedure Laterality Date  . FRACTURE SURGERY    . SHOULDER ARTHROSCOPY Left 1990s   "frozen shoulder"  . TIBIA FRACTURE SURGERY  2009   rod placed   . TONSILLECTOMY    . TOTAL ABDOMINAL HYSTERECTOMY N/A ~ 2000  . TUBAL LIGATION N/A ~ 1995    Social History: Social History   Tobacco Use  . Smoking status: Never Smoker  . Smokeless tobacco: Never Used  Substance Use Topics  . Alcohol use: No  . Drug use: No   Additional social history: Has 1-2 drinks  per month  Please also refer to relevant sections of EMR.  Family History: Family History  Problem Relation Age of Onset  . Thyroid disease Mother   . Osteoporosis Mother   . Prostate cancer Father   . CVA Father      Allergies and Medications: Not on File No known drug allergies. No current facility-administered medications on file prior to encounter.    Current Outpatient Medications on File Prior to Encounter  Medication Sig Dispense Refill  . aspirin EC 81 MG tablet Take 1 tablet (81 mg total) by mouth daily. 100 tablet 1  . calcium carbonate (CALCIUM 600) 600 MG TABS tablet Take 1 tablet (600 mg total) by mouth 2 (two) times daily with a meal. 60 tablet 1  . Cholecalciferol (VITAMIN D3) 2000 units TABS Take 1 tablet by mouth daily. 30 tablet 2  . diphenhydrAMINE (BENADRYL) 25 MG tablet Take 1 tablet (25 mg total) by mouth every 6 (six) hours as needed. 30 tablet 0  . fluticasone (FLONASE) 50 MCG/ACT nasal spray Place 2 sprays into both nostrils daily. 16 g 6  . Gluc-Chonn-MSM-Boswellia-Vit D (GLUCOSAMINE CHONDROITIN COMPLX) TABS Take 1 tablet by mouth daily. 100 tablet 1  . Multiple Vitamins-Minerals (MULTIVITAMIN ADULT) TABS Take 1 tablet by mouth daily. 100 tablet 1  . risedronate (ACTONEL) 30 MG tablet Take 1 tablet (30 mg total) by mouth every 7 (seven) days. with water on empty stomach, nothing by mouth or lie down for next 30 minutes. 4 tablet 11  . rosuvastatin (CRESTOR) 10 MG tablet Takes 3 times per week 90 tablet 3  Does not take benadryl or crestor.  Objective: BP 132/70 (BP Location: Left Arm)   Pulse 75   Temp 98.4 F (36.9 C) (Oral)   Resp 18   Ht 5\' 3"  (1.6 m)   Wt 140 lb 3.4 oz (63.6 kg)   SpO2 97%   BMI 24.84 kg/m   Exam: General: laying in bed comfortably, in NAD Eyes: EOMI, no scleral icterus ENTM: MMM, oropharynx nonerythematous Neck: supple, normal ROM Cardiovascular: RRR, no murmurs Respiratory: NWOB on room air, CTAB Gastrointestinal: TTP over RUQ with + murphy sign. No TTP on LUQ or RLQ. Soft. No rebound or guarding. + bowel sounds MSK: moving all limbs equally Derm: warm and dry. No jaundice Neuro: alert and awake, no focal deficits Psych: appropriate  affect  Labs and Imaging: CBC  Recent Labs  Lab 07/04/17 1821  WBC 12.4*  HGB 14.0  HCT 41.8  PLT 288     CMP     Component Value Date/Time   NA 140 07/04/2017 1821   K 4.0 07/04/2017 1821   CL 105 07/04/2017 1821   CO2 24 07/04/2017 1821   GLUCOSE 102 (H) 07/04/2017 1821   BUN 8 07/04/2017 1821   CREATININE 0.81 07/04/2017 1821   CALCIUM 9.0 07/04/2017 1821   PROT 6.7 07/04/2017 1821   ALBUMIN 3.8 07/04/2017 1821   AST 23 07/04/2017 1821   ALT 24 07/04/2017 1821   ALKPHOS 70 07/04/2017 1821   BILITOT 0.8 07/04/2017 1821   GFRNONAA >60 07/04/2017 1821   GFRAA >60 07/04/2017 1821    Amylase 65 Lipase 34  Urinalysis    Component Value Date/Time   COLORURINE YELLOW 07/04/2017 2059   APPEARANCEUR CLEAR 07/04/2017 2059   LABSPEC 1.012 07/04/2017 2059   PHURINE 7.0 07/04/2017 2059   GLUCOSEU NEGATIVE 07/04/2017 2059   HGBUR NEGATIVE 07/04/2017 2059   BILIRUBINUR NEGATIVE 07/04/2017 2059   KETONESUR  5 (A) 07/04/2017 2059   PROTEINUR NEGATIVE 07/04/2017 2059   NITRITE NEGATIVE 07/04/2017 2059   LEUKOCYTESUR SMALL (A) 07/04/2017 2059     US Abdomen Limited Ruq  Result Date: 07/04/2017 CLINICAL DATA:  Inpatient.  Right upper quadrant abdominal pain. EXAM: ULTRASOUND ABDOMEN LIMITED RIGHT UPPER QUADRANT COMPARISON:  None. FINDINGS: Gallbladder Multiple layering calcified gallstones in gallbladder measuring up to 2.3 cm in size. Moderate diffuse gallbladder wall thickening up to 9 mm thickness. Sonographic Percell Miller sign is present. No pericholecystic fluid. Common bile duct: Diameter: 3 mm Liver: Liver parenchyma is diffusely moderately echogenic with posterior acoustic attenuation, compatible with diffuse hepatic steatosis. No liver mass, noting decreased sensitivity in the setting of an echogenic liver. No definite liver surface irregularity. Portal vein is patent on color Doppler imaging with normal direction of blood flow towards the liver. IMPRESSION: 1. Cholelithiasis.  Moderate diffuse gallbladder wall thickening. Sonographic Percell Miller sign is present. Findings are compatible with acute cholecystitis in the correct clinical setting. 2. No biliary ductal dilatation. 3. Diffuse hepatic steatosis. Electronically Signed   By: Ilona Sorrel M.D.   On: 07/04/2017 19:20    Bufford Lope, DO 07/04/2017, 5:20 PM PGY-2, [place] Intern pager: [phone], text pages welcome

## 2017-07-04 NOTE — Progress Notes (Signed)
Pharmacy Antibiotic Note  [name] is a 67 y.o. female admitted on 07/04/2017 with RUQ pain x 3 days. Pharmacy has been consulted for Zosyn dosing for acute cholecystitis.  Patient's renal function is stable.  Afebrile, WBC 12.4.   Plan: Zosyn EID 3.375gm IV Q8H Pharmacy will sign off as dosage adjustment is likely unnecessary.  Thank you for the consult!   Height: 5\' 3"  (160 cm) Weight: 140 lb 3.4 oz (63.6 kg) IBW/kg (Calculated) : 52.4  Temp (24hrs), Avg:98.3 F (36.8 C), Min:98.2 F (36.8 C), Max:98.4 F (36.9 C)  Recent Labs  Lab 07/04/17 1821  WBC 12.4*  CREATININE 0.81    Estimated Creatinine Clearance: 63 mL/min (by C-G formula based on SCr of 0.81 mg/dL).    No Known Allergies  Zosyn 2/11 >>   Federica Allport D. Mina Marble, PharmD, BCPS Pager:  [phone] 07/04/2017, 9:07 PM

## 2017-07-04 NOTE — Progress Notes (Signed)
Attending Brief Admission Note  Patient seen and examined at approximately 3:15pm along with Dr. Lindell Noe at the [facility]. Briefly, 67 y.o. female presenting with RUQ pain x3 days, with associated vomiting and anorexia.  Exam shows tenderness in the RUQ with a frankly positive murphy sign. Patient is well hydrated with normal vital signs. Bowel sounds quiet but present with prolonged auscultation.  Patient has history of RUQ ultrasound done in 2004 which showed possibility of gallbladder polyps, with concern for chronic cholecystitis at that time. No stones seen on that 2004 ultrasound.  With + murphy sign, concern is for acute on chronic cholecystitis. Plan is for direct admission to hospital, antibiotic coverage with zosyn, RUQ ultrasound, KUB to rule out SBO (lower suspicion given focality of pain), and likely surgical consultation. Anticipate will need cholecystectomy this admission.  Will cosign resident H&P when it is available.  Chrisandra Netters, MD [place]

## 2017-07-05 ENCOUNTER — Inpatient Hospital Stay (HOSPITAL_COMMUNITY): Payer: BLUE CROSS/BLUE SHIELD | Admitting: Certified Registered Nurse Anesthetist

## 2017-07-05 ENCOUNTER — Encounter (HOSPITAL_COMMUNITY)
Admission: AD | Disposition: A | Discharge status: Home / Self Care | Payer: Self-pay | Source: Ambulatory Visit | Attending: Family Medicine

## 2017-07-05 DIAGNOSIS — K810 Acute cholecystitis: Secondary | ICD-10-CM

## 2017-07-05 HISTORY — PX: CHOLECYSTECTOMY: SHX55

## 2017-07-05 LAB — SURGICAL PCR SCREEN
MRSA, PCR: NEGATIVE
Staphylococcus aureus: NEGATIVE

## 2017-07-05 LAB — LIPID PANEL
CHOLESTEROL: 182 mg/dL (ref 0–200)
HDL: 41 mg/dL (ref >40)
LDL CALC: 114 mg/dL — AB (ref 0–99)
Total CHOL/HDL Ratio: 4.4 RATIO
Triglycerides: 133 mg/dL (ref <150)
VLDL: 27 mg/dL (ref 0–40)

## 2017-07-05 LAB — COMPREHENSIVE METABOLIC PANEL
ALK PHOS: 60 U/L (ref 38–126)
ALT: 24 U/L (ref 14–54)
ANION GAP: 11 (ref 5–15)
AST: 25 U/L (ref 15–41)
Albumin: 3.2 g/dL — ABNORMAL LOW (ref 3.5–5.0)
BUN: 8 mg/dL (ref 6–20)
CALCIUM: 8.3 mg/dL — AB (ref 8.9–10.3)
CO2: 22 mmol/L (ref 22–32)
Chloride: 108 mmol/L (ref 101–111)
Creatinine, Ser: 0.82 mg/dL (ref 0.44–1.00)
GFR calc Af Amer: >60 mL/min (ref >60)
GFR calc non Af Amer: >60 mL/min (ref >60)
GLUCOSE: 102 mg/dL — AB (ref 65–99)
Potassium: 3.7 mmol/L (ref 3.5–5.1)
SODIUM: 141 mmol/L (ref 135–145)
Total Bilirubin: 1.2 mg/dL (ref 0.3–1.2)
Total Protein: 5.9 g/dL — ABNORMAL LOW (ref 6.5–8.1)

## 2017-07-05 LAB — CBC
HCT: 37.4 % (ref 36.0–46.0)
HEMOGLOBIN: 12.5 g/dL (ref 12.0–15.0)
MCH: 29.3 pg (ref 26.0–34.0)
MCHC: 33.4 g/dL (ref 30.0–36.0)
MCV: 87.8 fL (ref 78.0–100.0)
Platelets: 256 10*3/uL (ref 150–400)
RBC: 4.26 MIL/uL (ref 3.87–5.11)
RDW: 13.4 % (ref 11.5–15.5)
WBC: 7.3 10*3/uL (ref 4.0–10.5)

## 2017-07-05 SURGERY — LAPAROSCOPIC CHOLECYSTECTOMY
Anesthesia: General | Site: Abdomen

## 2017-07-05 MED ORDER — OXYCODONE HCL 5 MG PO TABS: 5.0000 mg | ORAL_TABLET | Freq: Once | ORAL | Status: DC | PRN

## 2017-07-05 MED ORDER — FENTANYL CITRATE (PF) 250 MCG/5ML IJ SOLN
INTRAMUSCULAR | Status: AC
Start: 2017-07-05 — End: 2017-07-05
  Filled 2017-07-05: qty 5

## 2017-07-05 MED ORDER — SUGAMMADEX SODIUM 200 MG/2ML IV SOLN
INTRAVENOUS | Status: DC | PRN
  Administered 2017-07-05: 200 mg via INTRAVENOUS

## 2017-07-05 MED ORDER — 0.9 % SODIUM CHLORIDE (POUR BTL) OPTIME
TOPICAL | Status: DC | PRN
  Administered 2017-07-05: 1000 mL

## 2017-07-05 MED ORDER — MORPHINE SULFATE (PF) 4 MG/ML IV SOLN: 1.0000 mg | INTRAVENOUS | Status: DC | PRN

## 2017-07-05 MED ORDER — ROCURONIUM BROMIDE 10 MG/ML (PF) SYRINGE
PREFILLED_SYRINGE | INTRAVENOUS | Status: AC
  Filled 2017-07-05: qty 5

## 2017-07-05 MED ORDER — ROSUVASTATIN CALCIUM 10 MG PO TABS
10.0000 mg | ORAL_TABLET | Freq: Every day | ORAL | Status: DC
  Filled 2017-07-05: qty 1

## 2017-07-05 MED ORDER — HYDROMORPHONE HCL 1 MG/ML IJ SOLN
0.2500 mg | INTRAMUSCULAR | Status: DC | PRN
  Administered 2017-07-05: 0.5 mg via INTRAVENOUS

## 2017-07-05 MED ORDER — FENTANYL CITRATE (PF) 100 MCG/2ML IJ SOLN
INTRAMUSCULAR | Status: DC | PRN
  Administered 2017-07-05: 50 ug via INTRAVENOUS
  Administered 2017-07-05: 100 ug via INTRAVENOUS

## 2017-07-05 MED ORDER — MEPERIDINE HCL 50 MG/ML IJ SOLN: 6.2500 mg | INTRAMUSCULAR | Status: DC | PRN

## 2017-07-05 MED ORDER — SODIUM CHLORIDE 0.9 % IR SOLN
Status: DC | PRN
Start: 2017-07-05 — End: 2017-07-05
  Administered 2017-07-05: 1000 mL

## 2017-07-05 MED ORDER — IOPAMIDOL (ISOVUE-300) INJECTION 61%
INTRAVENOUS | Status: AC
  Filled 2017-07-05: qty 50

## 2017-07-05 MED ORDER — PROPOFOL 10 MG/ML IV BOLUS
INTRAVENOUS | Status: AC
  Filled 2017-07-05: qty 20

## 2017-07-05 MED ORDER — DEXAMETHASONE SODIUM PHOSPHATE 10 MG/ML IJ SOLN
INTRAMUSCULAR | Status: DC | PRN
  Administered 2017-07-05: 10 mg via INTRAVENOUS

## 2017-07-05 MED ORDER — ESMOLOL HCL 100 MG/10ML IV SOLN
INTRAVENOUS | Status: DC | PRN
  Administered 2017-07-05 (×2): 20 mg via INTRAVENOUS

## 2017-07-05 MED ORDER — PROPOFOL 10 MG/ML IV BOLUS
INTRAVENOUS | Status: DC | PRN
  Administered 2017-07-05: 150 mg via INTRAVENOUS

## 2017-07-05 MED ORDER — LACTATED RINGERS IV SOLN
INTRAVENOUS | Status: DC | PRN
  Administered 2017-07-05 (×2): via INTRAVENOUS

## 2017-07-05 MED ORDER — ONDANSETRON HCL 4 MG/2ML IJ SOLN
INTRAMUSCULAR | Status: DC | PRN
  Administered 2017-07-05 (×2): 4 mg via INTRAVENOUS

## 2017-07-05 MED ORDER — HYDROMORPHONE HCL 1 MG/ML IJ SOLN
INTRAMUSCULAR | Status: AC
  Filled 2017-07-05: qty 1

## 2017-07-05 MED ORDER — OXYCODONE HCL 5 MG PO TABS
5.0000 mg | ORAL_TABLET | ORAL | Status: DC | PRN
  Administered 2017-07-05: 10 mg via ORAL
  Administered 2017-07-05: 5 mg via ORAL
  Filled 2017-07-05: qty 2
  Filled 2017-07-05: qty 1

## 2017-07-05 MED ORDER — BUPIVACAINE-EPINEPHRINE 0.5% -1:200000 IJ SOLN
INTRAMUSCULAR | Status: DC | PRN
  Administered 2017-07-05: 20 mL

## 2017-07-05 MED ORDER — ROCURONIUM BROMIDE 100 MG/10ML IV SOLN
INTRAVENOUS | Status: DC | PRN
  Administered 2017-07-05: 50 mg via INTRAVENOUS

## 2017-07-05 MED ORDER — PROMETHAZINE HCL 25 MG/ML IJ SOLN: 6.2500 mg | INTRAMUSCULAR | Status: DC | PRN

## 2017-07-05 MED ORDER — OXYCODONE HCL 5 MG/5ML PO SOLN: 5.0000 mg | Freq: Once | ORAL | Status: DC | PRN

## 2017-07-05 MED ORDER — LIDOCAINE 2% (20 MG/ML) 5 ML SYRINGE
INTRAMUSCULAR | Status: DC | PRN
  Administered 2017-07-05: 80 mg via INTRAVENOUS

## 2017-07-05 MED ORDER — MIDAZOLAM HCL 2 MG/2ML IJ SOLN
INTRAMUSCULAR | Status: AC
  Filled 2017-07-05: qty 2

## 2017-07-05 MED ORDER — KETOROLAC TROMETHAMINE 30 MG/ML IJ SOLN
INTRAMUSCULAR | Status: DC | PRN
  Administered 2017-07-05: 30 mg via INTRAVENOUS

## 2017-07-05 MED ORDER — LIDOCAINE 2% (20 MG/ML) 5 ML SYRINGE
INTRAMUSCULAR | Status: AC
Start: 2017-07-05 — End: 2017-07-05
  Filled 2017-07-05: qty 5

## 2017-07-05 MED ORDER — MIDAZOLAM HCL 2 MG/2ML IJ SOLN
INTRAMUSCULAR | Status: DC | PRN
  Administered 2017-07-05: 2 mg via INTRAVENOUS

## 2017-07-05 MED ORDER — BUPIVACAINE-EPINEPHRINE (PF) 0.5% -1:200000 IJ SOLN
INTRAMUSCULAR | Status: AC
  Filled 2017-07-05: qty 30

## 2017-07-05 SURGICAL SUPPLY — 38 items
ADH SKN CLS APL DERMABOND .7 (GAUZE/BANDAGES/DRESSINGS) ×2
APPLIER CLIP 5 13 M/L LIGAMAX5 (MISCELLANEOUS) ×3
APR CLP MED LRG 5 ANG JAW (MISCELLANEOUS) ×2
BAG SPEC RTRVL LRG 6X4 10 (ENDOMECHANICALS) ×2
BLADE CLIPPER SURG (BLADE) IMPLANT
CANISTER SUCT 3000ML PPV (MISCELLANEOUS) ×3 IMPLANT
CHLORAPREP W/TINT 26ML (MISCELLANEOUS) ×3 IMPLANT
CLIP APPLIE 5 13 M/L LIGAMAX5 (MISCELLANEOUS) ×2 IMPLANT
COVER MAYO STAND STRL (DRAPES) IMPLANT
COVER SURGICAL LIGHT HANDLE (MISCELLANEOUS) ×3 IMPLANT
DERMABOND ADVANCED (GAUZE/BANDAGES/DRESSINGS) ×1
DERMABOND ADVANCED .7 DNX12 (GAUZE/BANDAGES/DRESSINGS) ×2 IMPLANT
DRAPE C-ARM 42X72 X-RAY (DRAPES) IMPLANT
ELECT REM PT RETURN 9FT ADLT (ELECTROSURGICAL) ×3
ELECTRODE REM PT RTRN 9FT ADLT (ELECTROSURGICAL) ×2 IMPLANT
GLOVE SURG SIGNA 7.5 PF LTX (GLOVE) ×3 IMPLANT
GOWN STRL REUS W/ TWL LRG LVL3 (GOWN DISPOSABLE) ×4 IMPLANT
GOWN STRL REUS W/ TWL XL LVL3 (GOWN DISPOSABLE) ×2 IMPLANT
GOWN STRL REUS W/TWL LRG LVL3 (GOWN DISPOSABLE) ×6
GOWN STRL REUS W/TWL XL LVL3 (GOWN DISPOSABLE) ×3
KIT BASIN OR (CUSTOM PROCEDURE TRAY) ×3 IMPLANT
KIT ROOM TURNOVER OR (KITS) ×3 IMPLANT
NS IRRIG 1000ML POUR BTL (IV SOLUTION) ×3 IMPLANT
PAD ARMBOARD 7.5X6 YLW CONV (MISCELLANEOUS) ×3 IMPLANT
POUCH SPECIMEN RETRIEVAL 10MM (ENDOMECHANICALS) ×3 IMPLANT
SCISSORS LAP 5X35 DISP (ENDOMECHANICALS) ×3 IMPLANT
SET CHOLANGIOGRAPH 5 50 .035 (SET/KITS/TRAYS/PACK) IMPLANT
SET IRRIG TUBING LAPAROSCOPIC (IRRIGATION / IRRIGATOR) ×3 IMPLANT
SLEEVE ENDOPATH XCEL 5M (ENDOMECHANICALS) ×6 IMPLANT
SPECIMEN JAR SMALL (MISCELLANEOUS) ×3 IMPLANT
SUT MNCRL AB 4-0 PS2 18 (SUTURE) ×3 IMPLANT
TOWEL OR 17X24 6PK STRL BLUE (TOWEL DISPOSABLE) ×3 IMPLANT
TOWEL OR 17X26 10 PK STRL BLUE (TOWEL DISPOSABLE) ×3 IMPLANT
TRAY LAPAROSCOPIC MC (CUSTOM PROCEDURE TRAY) ×3 IMPLANT
TROCAR XCEL BLUNT TIP 100MML (ENDOMECHANICALS) ×3 IMPLANT
TROCAR XCEL NON-BLD 5MMX100MML (ENDOMECHANICALS) ×3 IMPLANT
TUBING INSUFFLATION (TUBING) ×3 IMPLANT
WATER STERILE IRR 1000ML POUR (IV SOLUTION) ×3 IMPLANT

## 2017-07-05 NOTE — Transfer of Care (Signed)
Immediate Anesthesia Transfer of Care Note  Patient: [name]  Procedure(s) Performed: LAPAROSCOPIC CHOLECYSTECTOMY (N/A Abdomen)  Patient Location: PACU  Anesthesia Type:General  Level of Consciousness: awake, alert  and patient cooperative  Airway & Oxygen Therapy: Patient Spontanous Breathing and Patient connected to nasal cannula oxygen  Post-op Assessment: Report given to RN, Post -op Vital signs reviewed and stable and Patient moving all extremities  Post vital signs: Reviewed and stable  Last Vitals:  Vitals:   07/05/17 0542 07/05/17 1146  BP: 125/69 134/69  Pulse: 72 86  Resp: 18 14  Temp: 36.7 C (!) 36.3 C  SpO2: 100% 97%    Last Pain:  Vitals:   07/05/17 1146  TempSrc:   PainSc: (P) Asleep         Complications: No apparent anesthesia complications

## 2017-07-05 NOTE — Progress Notes (Signed)
Interim Progress Note:  Went up to check on patient after her lap chole earlier today. Patient states she is doing well. She is having some "soreness" of her abdomen, but she is not having true pain. She also endorses right shoulder pain, which is better with the pain medications. She was able to drink some chicken broth and ice cream for dinner. She denies any nausea.   On exam, she is well-appearing and alert. Abdomen non-distended. Surgical incision sites are clean  Plan to change to soft diet in the morning. Will continue to monitor.  Hyman Bible, MD PGY-3

## 2017-07-05 NOTE — Progress Notes (Signed)
Family Medicine Teaching Service Daily Progress Note Intern Pager: [phone]  Patient name: [name] Medical record number: [id number] Date of birth: 29-Jul-1950 Age: 67 y.o. Gender: female  Primary Care Provider: Alveda Reasons, MD Consultants: Surgery Code Status: Full  Pt Overview and Major Events to Date:  2/11 - admitted for abdominal pain  Assessment and Plan: April HEINEMANN is a 67 y.o. female presenting with abdominal pain. PMH is significant for hyperlipidemia not on medications.  Abdominal pain 2/2 acute cholecystitis. RUQ abdominal pain with positive Murphy sign on admission. Abd US showing gallstone with gallbladder wall thickening consistent with acute cholecystitis, no biliary duct dilation. Patient is well appearing, stable vital signs overnight, afebrile and leukocytosis improving 12.4>7.3. Did not require prn Oxy IR overnight. Surgery consulted and plan for cholecystectomy today, continue IV Zosyn. - remains NPO  - IV zosyn (2/11-) - consult surgery, plan for cholecystectomy today - MIVF NS@100cc /hr - prn tylenol for mild pain - prn oxy 5mg  q4prn for breakthrough - prn zofran for nausea  H/o hyperlipidemia. Patient noncompliant on home crestor. Last LDL 104 on 09/17/16, LDL 114 this admission. Will likely restart statin once out of surgery and tolerating diet. - continue home ASA81   FEN/GI: NPO until surgery Prophylaxis: lovenox  Disposition: continue inpatient management, surgery today  Subjective:  Patient with persistent diffuse abdominal pain, able to sit up without assistance. Questions about long term diet restrictions answered.   Objective: Temp:  [98 F (36.7 C)-98.4 F (36.9 C)] 98 F (36.7 C) (02/12 0542) Pulse Rate:  [72-79] 72 (02/12 0542) Resp:  [18] 18 (02/12 0542) BP: (125-148)/(69-76) 125/69 (02/12 0542) SpO2:  [97 %-100 %] 100 % (02/12 0542) Weight:  [140 lb 3.4 oz (63.6 kg)-141 lb 12.8 oz (64.3 kg)] 140 lb 3.4 oz (63.6 kg)  (02/11 1652) Physical Exam: General: pleasant female in NAD, lying in bed Cardiovascular: RRR, no murmurs/rubs/gallops Respiratory: CTAB, no wheezes/rales/rhonchi, comfortable WOB on RA Abdomen: +BS, deferred palpation due to acute pain and known cholecystitis, nondistended. Able to sit forward without assistance. No evidence of jaundice. Extremities: warm and well perfused, no LE edema  Laboratory: Recent Labs  Lab 07/04/17 1821 07/05/17 0555  WBC 12.4* 7.3  HGB 14.0 12.5  HCT 41.8 37.4  PLT 288 256   Recent Labs  Lab 07/04/17 1821 07/05/17 0555  NA 140 141  K 4.0 3.7  CL 105 108  CO2 24 22  BUN 8 8  CREATININE 0.81 0.82  CALCIUM 9.0 8.3*  PROT 6.7 5.9*  BILITOT 0.8 1.2  ALKPHOS 70 60  ALT 24 24  AST 23 25  GLUCOSE 102* 102*   Imaging/Diagnostic Tests: US Abdomen Limited Ruq  Result Date: 07/04/2017 CLINICAL DATA:  Inpatient.  Right upper quadrant abdominal pain. EXAM: ULTRASOUND ABDOMEN LIMITED RIGHT UPPER QUADRANT COMPARISON:  None. FINDINGS: Gallbladder Multiple layering calcified gallstones in gallbladder measuring up to 2.3 cm in size. Moderate diffuse gallbladder wall thickening up to 9 mm thickness. Sonographic Percell Miller sign is present. No pericholecystic fluid. Common bile duct: Diameter: 3 mm Liver: Liver parenchyma is diffusely moderately echogenic with posterior acoustic attenuation, compatible with diffuse hepatic steatosis. No liver mass, noting decreased sensitivity in the setting of an echogenic liver. No definite liver surface irregularity. Portal vein is patent on color Doppler imaging with normal direction of blood flow towards the liver. IMPRESSION: 1. Cholelithiasis. Moderate diffuse gallbladder wall thickening. Sonographic Percell Miller sign is present. Findings are compatible with acute cholecystitis in the correct clinical setting.  2. No biliary ductal dilatation. 3. Diffuse hepatic steatosis. Electronically Signed   By: Ilona Sorrel M.D.   On: 07/04/2017 19:20    April Percy, DO 07/05/2017, 9:25 AM PGY-1, [place] Intern pager: [phone], text pages welcome

## 2017-07-05 NOTE — Anesthesia Preprocedure Evaluation (Signed)
Anesthesia Evaluation  Patient identified by MRN, date of birth, ID band Patient awake    Reviewed: Allergy & Precautions, NPO status , Patient's Chart, lab work & pertinent test results  Airway Mallampati: II  TM Distance: >3 FB Neck ROM: Full    Dental no notable dental hx.    Pulmonary neg pulmonary ROS,    Pulmonary exam normal breath sounds clear to auscultation       Cardiovascular negative cardio ROS Normal cardiovascular exam Rhythm:Regular Rate:Normal     Neuro/Psych negative neurological ROS  negative psych ROS   GI/Hepatic negative GI ROS, Neg liver ROS,   Endo/Other  negative endocrine ROS  Renal/GU negative Renal ROS  negative genitourinary   Musculoskeletal negative musculoskeletal ROS (+)   Abdominal   Peds negative pediatric ROS (+)  Hematology negative hematology ROS (+)   Anesthesia Other Findings Cholecystitis  Reproductive/Obstetrics negative OB ROS                             Anesthesia Physical Anesthesia Plan  ASA: II  Anesthesia Plan: General   Post-op Pain Management:    Induction: Intravenous  PONV Risk Score and Plan: 3 and Ondansetron, Dexamethasone and Midazolam  Airway Management Planned: Oral ETT  Additional Equipment:   Intra-op Plan:   Post-operative Plan: Extubation in OR  Informed Consent: I have reviewed the patients History and Physical, chart, labs and discussed the procedure including the risks, benefits and alternatives for the proposed anesthesia with the patient or authorized representative who has indicated his/her understanding and acceptance.   Dental advisory given  Plan Discussed with: CRNA  Anesthesia Plan Comments:         Anesthesia Quick Evaluation

## 2017-07-05 NOTE — Consult Note (Signed)
[facility] Surgery Consult/Admission Note  [name] March 31, 1951  [id number].    Requesting MD: Dr. Mingo Amber Chief Complaint/Reason for Consult: cholecystitis  HPI:   Pt is a otherwise healthy 67 yo female with a history of hyperlipidemia, previous abdominal hysterectomy, who presented to the ED with complaints of abdominal pain since Saturday. Similar episode of pain roughly one month ago but it resolved. She states this pain has not resolved. Pain is in her RUQ and radiated into her back. It is constant, severe at times, with associated nausea, vomiting and chills. She states movement makes it worse. No changes in bowel habits, blood in stool, fever, dysuria. No CP or SOB. US showed Cholelithiasis. Moderate diffuse gallbladder wall thickening. Sonographic Percell Miller sign is present. Pt states a baby ASA daily.   ROS:  Review of Systems  Constitutional: Positive for chills. Negative for diaphoresis and fever.  HENT: Negative for sore throat.   Respiratory: Negative for cough and shortness of breath.   Cardiovascular: Negative for chest pain.  Gastrointestinal: Positive for abdominal pain, nausea and vomiting. Negative for blood in stool, constipation and diarrhea.  Genitourinary: Negative for dysuria and hematuria.  Skin: Negative for rash.  Neurological: Negative for dizziness and loss of consciousness.  All other systems reviewed and are negative.    Family History  Problem Relation Age of Onset  . Thyroid disease Mother   . Osteoporosis Mother   . Prostate cancer Father   . CVA Father     Past Medical History:  Diagnosis Date  . Postmenopausal hormone replacement therapy     Past Surgical History:  Procedure Laterality Date  . FRACTURE SURGERY    . SHOULDER ARTHROSCOPY Left 1990s   "frozen shoulder"  . TIBIA FRACTURE SURGERY  2009   rod placed   . TONSILLECTOMY    . TOTAL ABDOMINAL HYSTERECTOMY N/A ~ 2000  . TUBAL LIGATION N/A ~ 1995    Social History:   reports that  has never smoked. she has never used smokeless tobacco. She reports that she does not drink alcohol or use drugs.  Allergies: No Known Allergies  Medications Prior to Admission  Medication Sig Dispense Refill  . aspirin EC 81 MG tablet Take 1 tablet (81 mg total) by mouth daily. 100 tablet 1  . calcium carbonate (CALCIUM 600) 600 MG TABS tablet Take 1 tablet (600 mg total) by mouth 2 (two) times daily with a meal. 60 tablet 1  . Cholecalciferol (VITAMIN D3) 2000 units TABS Take 1 tablet by mouth daily. 30 tablet 2  . diphenhydrAMINE (BENADRYL) 25 MG tablet Take 1 tablet (25 mg total) by mouth every 6 (six) hours as needed. 30 tablet 0  . fluticasone (FLONASE) 50 MCG/ACT nasal spray Place 2 sprays into both nostrils daily. 16 g 6  . Gluc-Chonn-MSM-Boswellia-Vit D (GLUCOSAMINE CHONDROITIN COMPLX) TABS Take 1 tablet by mouth daily. 100 tablet 1  . Multiple Vitamins-Minerals (MULTIVITAMIN ADULT) TABS Take 1 tablet by mouth daily. 100 tablet 1  . risedronate (ACTONEL) 30 MG tablet Take 1 tablet (30 mg total) by mouth every 7 (seven) days. with water on empty stomach, nothing by mouth or lie down for next 30 minutes. 4 tablet 11  . rosuvastatin (CRESTOR) 10 MG tablet Takes 3 times per week 90 tablet 3    Blood pressure 125/69, pulse 72, temperature 98 F (36.7 C), temperature source Oral, resp. rate 18, height 5' 3"  (1.6 m), weight 140 lb 3.4 oz (63.6 kg), SpO2 100 %.  Physical  Exam  Constitutional: She is oriented to person, place, and time and well-developed, well-nourished, and in no distress. Vital signs are normal. No distress.  HENT:  Head: Normocephalic and atraumatic.  Nose: Nose normal.  Mouth/Throat: Mucous membranes are normal.  Eyes: Conjunctivae are normal. Pupils are equal, round, and reactive to light. Right eye exhibits no discharge. Left eye exhibits no discharge. No scleral icterus.  Neck: Normal range of motion. Neck supple. No thyromegaly present.   Cardiovascular: Normal rate, regular rhythm, normal heart sounds and intact distal pulses. Exam reveals no gallop and no friction rub.  No murmur heard. Pulses:      Radial pulses are 2+ on the right side, and 2+ on the left side.       Dorsalis pedis pulses are 2+ on the right side, and 2+ on the left side.  Pulmonary/Chest: Effort normal and breath sounds normal. No respiratory distress. She has no decreased breath sounds. She has no wheezes. She has no rhonchi. She has no rales.  Abdominal: Soft. Normal appearance and bowel sounds are normal. She exhibits no distension and no mass. There is no hepatosplenomegaly. There is tenderness in the right upper quadrant. There is no rebound and no guarding.  Musculoskeletal: Normal range of motion. She exhibits no edema, tenderness or deformity.  Lymphadenopathy:    She has no cervical adenopathy.  Neurological: She is alert and oriented to person, place, and time.  Skin: Skin is warm and dry. No rash noted. She is not diaphoretic.  Psychiatric: Mood and affect normal.  Nursing note and vitals reviewed.   Results for orders placed or performed during the hospital encounter of 07/04/17 (from the past 48 hour(s))  CBC     Status: Abnormal   Collection Time: 07/04/17  6:21 PM  Result Value Ref Range   WBC 12.4 (H) 4.0 - 10.5 K/uL   RBC 4.80 3.87 - 5.11 MIL/uL   Hemoglobin 14.0 12.0 - 15.0 g/dL   HCT 41.8 36.0 - 46.0 %   MCV 87.1 78.0 - 100.0 fL   MCH 29.2 26.0 - 34.0 pg   MCHC 33.5 30.0 - 36.0 g/dL   RDW 13.4 11.5 - 15.5 %   Platelets 288 150 - 400 K/uL    Comment: Performed at [facility] [address], [place], [place] [address]  Comprehensive metabolic panel     Status: Abnormal   Collection Time: 07/04/17  6:21 PM  Result Value Ref Range   Sodium 140 135 - 145 mmol/L   Potassium 4.0 3.5 - 5.1 mmol/L   Chloride 105 101 - 111 mmol/L   CO2 24 22 - 32 mmol/L   Glucose, Bld 102 (H) 65 - 99 mg/dL   BUN 8 6 - 20 mg/dL    Creatinine, Ser 0.81 0.44 - 1.00 mg/dL   Calcium 9.0 8.9 - 10.3 mg/dL   Total Protein 6.7 6.5 - 8.1 g/dL   Albumin 3.8 3.5 - 5.0 g/dL   AST 23 15 - 41 U/L   ALT 24 14 - 54 U/L   Alkaline Phosphatase 70 38 - 126 U/L   Total Bilirubin 0.8 0.3 - 1.2 mg/dL   GFR calc non Af Amer >60 >60 mL/min   GFR calc Af Amer >60 >60 mL/min    Comment: (NOTE) The eGFR has been calculated using the CKD EPI equation. This calculation has not been validated in all clinical situations. eGFR's persistently <60 mL/min signify possible Chronic Kidney Disease.    Anion  gap 11 5 - 15    Comment: Performed at [facility], [place] [address], [place], [place] [address]  Lipase, blood     Status: None   Collection Time: 07/04/17  6:21 PM  Result Value Ref Range   Lipase 34 11 - 51 U/L    Comment: Performed at [facility] [address], [place], [place] [address]  Amylase     Status: None   Collection Time: 07/04/17  6:21 PM  Result Value Ref Range   Amylase 65 28 - 100 U/L    Comment: Performed at [facility] [address], [place], [place] [address]  Urinalysis, Routine w reflex microscopic     Status: Abnormal   Collection Time: 07/04/17  8:59 PM  Result Value Ref Range   Color, Urine YELLOW YELLOW   APPearance CLEAR CLEAR   Specific Gravity, Urine 1.012 1.005 - 1.030   pH 7.0 5.0 - 8.0   Glucose, UA NEGATIVE NEGATIVE mg/dL   Hgb urine dipstick NEGATIVE NEGATIVE   Bilirubin Urine NEGATIVE NEGATIVE   Ketones, ur 5 (A) NEGATIVE mg/dL   Protein, ur NEGATIVE NEGATIVE mg/dL   Nitrite NEGATIVE NEGATIVE   Leukocytes, UA SMALL (A) NEGATIVE   RBC / HPF 0-5 0 - 5 RBC/hpf   WBC, UA 6-30 0 - 5 WBC/hpf   Bacteria, UA NONE SEEN NONE SEEN   Squamous Epithelial / LPF 0-5 (A) NONE SEEN    Comment: Performed at [facility], [place] [address] [place],  [address]  Comprehensive metabolic panel     Status: Abnormal   Collection Time: 07/05/17  5:55 AM  Result Value Ref Range    Sodium 141 135 - 145 mmol/L   Potassium 3.7 3.5 - 5.1 mmol/L   Chloride 108 101 - 111 mmol/L   CO2 22 22 - 32 mmol/L   Glucose, Bld 102 (H) 65 - 99 mg/dL   BUN 8 6 - 20 mg/dL   Creatinine, Ser 0.82 0.44 - 1.00 mg/dL   Calcium 8.3 (L) 8.9 - 10.3 mg/dL   Total Protein 5.9 (L) 6.5 - 8.1 g/dL   Albumin 3.2 (L) 3.5 - 5.0 g/dL   AST 25 15 - 41 U/L   ALT 24 14 - 54 U/L   Alkaline Phosphatase 60 38 - 126 U/L   Total Bilirubin 1.2 0.3 - 1.2 mg/dL   GFR calc non Af Amer >60 >60 mL/min   GFR calc Af Amer >60 >60 mL/min    Comment: (NOTE) The eGFR has been calculated using the CKD EPI equation. This calculation has not been validated in all clinical situations. eGFR's persistently <60 mL/min signify possible Chronic Kidney Disease.    Anion gap 11 5 - 15    Comment: Performed at [facility] [address] [place] [address]  CBC     Status: None   Collection Time: 07/05/17  5:55 AM  Result Value Ref Range   WBC 7.3 4.0 - 10.5 K/uL   RBC 4.26 3.87 - 5.11 MIL/uL   Hemoglobin 12.5 12.0 - 15.0 g/dL   HCT 37.4 36.0 - 46.0 %   MCV 87.8 78.0 - 100.0 fL   MCH 29.3 26.0 - 34.0 pg   MCHC 33.4 30.0 - 36.0 g/dL   RDW 13.4 11.5 - 15.5 %   Platelets 256 150 - 400 K/uL    Comment: Performed at [facility], [place],  [place] [address]  Lipid panel     Status: Abnormal   Collection Time: 07/05/17  5:55 AM  Result Value Ref Range   Cholesterol 182 0 - 200 mg/dL   Triglycerides 133 <150 mg/dL   HDL 41 >40 mg/dL   Total CHOL/HDL Ratio 4.4 RATIO   VLDL 27 0 - 40 mg/dL   LDL Cholesterol 114 (H) 0 - 99 mg/dL    Comment:        Total Cholesterol/HDL:CHD Risk Coronary Heart Disease Risk Table                     Men   Women  1/2 Average Risk   3.4   3.3  Average Risk       5.0   4.4  2 X Average Risk   9.6   7.1  3 X Average Risk  23.4   11.0        Use the calculated Patient Ratio above and the CHD Risk Table to determine the patient's CHD Risk.         ATP III CLASSIFICATION (LDL):  <100     mg/dL   Optimal  100-129  mg/dL   Near or Above                    Optimal  130-159  mg/dL   Borderline  160-189  mg/dL   High  >190     mg/dL   Very High Performed at [facility] [address], [place], [place] [address]    US Abdomen Limited Ruq  Result Date: 07/04/2017 CLINICAL DATA:  Inpatient.  Right upper quadrant abdominal pain. EXAM: ULTRASOUND ABDOMEN LIMITED RIGHT UPPER QUADRANT COMPARISON:  None. FINDINGS: Gallbladder Multiple layering calcified gallstones in gallbladder measuring up to 2.3 cm in size. Moderate diffuse gallbladder wall thickening up to 9 mm thickness. Sonographic Percell Miller sign is present. No pericholecystic fluid. Common bile duct: Diameter: 3 mm Liver: Liver parenchyma is diffusely moderately echogenic with posterior acoustic attenuation, compatible with diffuse hepatic steatosis. No liver mass, noting decreased sensitivity in the setting of an echogenic liver. No definite liver surface irregularity. Portal vein is patent on color Doppler imaging with normal direction of blood flow towards the liver. IMPRESSION: 1. Cholelithiasis. Moderate diffuse gallbladder wall thickening. Sonographic Percell Miller sign is present. Findings are compatible with acute cholecystitis in the correct clinical setting. 2. No biliary ductal dilatation. 3. Diffuse hepatic steatosis. Electronically Signed   By: Ilona Sorrel M.D.   On: 07/04/2017 19:20      Assessment/Plan  Cholecystitis - OR today for lap chole - continue IV zosyn  - NPO  Thank you for the consult. We will follow.   Kalman Drape, [facility] Surgery 07/05/2017, 7:44 AM Pager: [phone] Consults: [phone] Mon-Fri 7:00 am-4:30 pm Sat-Sun 7:00 am-11:30 am

## 2017-07-05 NOTE — Anesthesia Procedure Notes (Signed)
Procedure Name: Intubation Date/Time: 07/05/2017 10:43 AM Performed by: Leonor Liv, CRNA Pre-anesthesia Checklist: Patient identified, Emergency Drugs available, Suction available and Patient being monitored Patient Re-evaluated:Patient Re-evaluated prior to induction Oxygen Delivery Method: Circle System Utilized Preoxygenation: Pre-oxygenation with 100% oxygen Induction Type: IV induction Ventilation: Mask ventilation without difficulty Laryngoscope Size: Mac and 3 Grade View: Grade I Tube type: Oral Tube size: 7.0 mm Number of attempts: 1 Airway Equipment and Method: Stylet and Oral airway Placement Confirmation: ETT inserted through vocal cords under direct vision,  positive ETCO2 and breath sounds checked- equal and bilateral Secured at: 21 cm Tube secured with: Tape Dental Injury: Teeth and Oropharynx as per pre-operative assessment

## 2017-07-05 NOTE — Op Note (Signed)
Laparoscopic Cholecystectomy Procedure Note  Indications: This patient presents with symptomatic gallbladder disease and will undergo laparoscopic cholecystectomy.  Pre-operative Diagnosis: acute cholecystitis with gallstones  Post-operative Diagnosis: Same  Surgeon: Coralie Keens A   Assistants: Jackson Latino, PA  Anesthesia: General endotracheal anesthesia  ASA Class: 2  Procedure Details  The patient was seen again in the Holding Room. The risks, benefits, complications, treatment options, and expected outcomes were discussed with the patient. The possibilities of reaction to medication, pulmonary aspiration, perforation of viscus, bleeding, recurrent infection, finding a normal gallbladder, the need for additional procedures, failure to diagnose a condition, the possible need to convert to an open procedure, and creating a complication requiring transfusion or operation were discussed with the patient. The likelihood of improving the patient's symptoms with return to their baseline status is good.  The patient and/or family concurred with the proposed plan, giving informed consent. The site of surgery properly noted. The patient was taken to Operating Room, identified as [name] and the procedure verified as Laparoscopic Cholecystectomy with Intraoperative Cholangiogram. A Time Out was held and the above information confirmed.  Prior to the induction of general anesthesia, antibiotic prophylaxis was administered. General endotracheal anesthesia was then administered and tolerated well. After the induction, the abdomen was prepped with Chloraprep and draped in sterile fashion. The patient was positioned in the supine position.  Local anesthetic agent was injected into the skin near the umbilicus and an incision made. We dissected down to the abdominal fascia with blunt dissection.  The fascia was incised vertically and we entered the peritoneal cavity bluntly.  A pursestring  suture of 0-Vicryl was placed around the fascial opening.  The Hasson cannula was inserted and secured with the stay suture.  Pneumoperitoneum was then created with CO2 and tolerated well without any adverse changes in the patient's vital signs. A 5-mm port was placed in the subxiphoid position.  Two 5-mm ports were placed in the right upper quadrant. All skin incisions were infiltrated with a local anesthetic agent before making the incision and placing the trocars.   We positioned the patient in reverse Trendelenburg, tilted slightly to the patient's left.  The gallbladder was identified and found to be distended and inflamed. I had to aspirate bile from the gallbladder in order to grasp it. Once this was done, the fundus grasped and retracted cephalad. Adhesions were lysed bluntly and with the electrocautery where indicated, taking care not to injure any adjacent organs or viscus. The infundibulum was grasped and retracted laterally, exposing the peritoneum overlying the triangle of Calot. This was then divided and exposed in a blunt fashion. The cystic duct was clearly identified and bluntly dissected circumferentially. A critical view of the cystic duct and cystic artery was obtained.  The cystic duct was then ligated with clips and divided. The cystic artery was, dissected free, ligated with clips and divided as well.   The gallbladder was dissected from the liver bed in retrograde fashion with the electrocautery. The gallbladder was removed and placed in an Endocatch sac. The liver bed was irrigated and inspected. Hemostasis was achieved with the electrocautery. Copious irrigation was utilized and was repeatedly aspirated until clear.  The gallbladder and Endocatch sac were then removed through the umbilical port site.  The pursestring suture was used to close the umbilical fascia.    We again inspected the right upper quadrant for hemostasis.  Pneumoperitoneum was released as we removed the trocars.   4-0 Monocryl was used to close the  skin.   Skin glue was then applied. The patient was then extubated and brought to the recovery room in stable condition. Instrument, sponge, and needle counts were correct at closure and at the conclusion of the case.   Findings: Cholecystitis with Cholelithiasis  Estimated Blood Loss: Minimal         Drains: 0         Specimens: Gallbladder           Complications: None; patient tolerated the procedure well.         Disposition: PACU - hemodynamically stable.         Condition: stable

## 2017-07-05 NOTE — Anesthesia Postprocedure Evaluation (Signed)
Anesthesia Post Note  Patient: [name]  Procedure(s) Performed: LAPAROSCOPIC CHOLECYSTECTOMY (N/A Abdomen)     Patient location during evaluation: PACU Anesthesia Type: General Level of consciousness: awake and alert Pain management: pain level controlled Vital Signs Assessment: post-procedure vital signs reviewed and stable Respiratory status: spontaneous breathing, nonlabored ventilation and respiratory function stable Cardiovascular status: blood pressure returned to baseline and stable Postop Assessment: no apparent nausea or vomiting Anesthetic complications: no    Last Vitals:  Vitals:   07/05/17 1225 07/05/17 1241  BP: (!) 141/71 (!) 145/74  Pulse: 74 73  Resp: 15 14  Temp: (!) 36.3 C (!) 36.4 C  SpO2: 93% 98%    Last Pain:  Vitals:   07/05/17 1241  TempSrc: Oral  PainSc:                  Lynda Rainwater

## 2017-07-06 ENCOUNTER — Encounter (HOSPITAL_COMMUNITY): Payer: Self-pay | Admitting: Surgery

## 2017-07-06 DIAGNOSIS — E785 Hyperlipidemia, unspecified: Secondary | ICD-10-CM

## 2017-07-06 DIAGNOSIS — K810 Acute cholecystitis: Secondary | ICD-10-CM

## 2017-07-06 LAB — CBC
HEMATOCRIT: 34 % — AB (ref 36.0–46.0)
HEMOGLOBIN: 11.2 g/dL — AB (ref 12.0–15.0)
MCH: 28.9 pg (ref 26.0–34.0)
MCHC: 32.9 g/dL (ref 30.0–36.0)
MCV: 87.9 fL (ref 78.0–100.0)
Platelets: 236 10*3/uL (ref 150–400)
RBC: 3.87 MIL/uL (ref 3.87–5.11)
RDW: 13.2 % (ref 11.5–15.5)
WBC: 15.6 10*3/uL — ABNORMAL HIGH (ref 4.0–10.5)

## 2017-07-06 LAB — COMPREHENSIVE METABOLIC PANEL
ALBUMIN: 2.9 g/dL — AB (ref 3.5–5.0)
ALK PHOS: 63 U/L (ref 38–126)
ALT: 117 U/L — AB (ref 14–54)
ANION GAP: 12 (ref 5–15)
AST: 96 U/L — ABNORMAL HIGH (ref 15–41)
BUN: 7 mg/dL (ref 6–20)
CALCIUM: 8 mg/dL — AB (ref 8.9–10.3)
CHLORIDE: 108 mmol/L (ref 101–111)
CO2: 20 mmol/L — AB (ref 22–32)
Creatinine, Ser: 0.78 mg/dL (ref 0.44–1.00)
GFR calc Af Amer: >60 mL/min (ref >60)
GFR calc non Af Amer: >60 mL/min (ref >60)
GLUCOSE: 115 mg/dL — AB (ref 65–99)
Potassium: 4.1 mmol/L (ref 3.5–5.1)
SODIUM: 140 mmol/L (ref 135–145)
Total Bilirubin: 0.7 mg/dL (ref 0.3–1.2)
Total Protein: 5.2 g/dL — ABNORMAL LOW (ref 6.5–8.1)

## 2017-07-06 MED ORDER — OXYCODONE HCL 5 MG PO TABS: 5.0000 mg | ORAL_TABLET | Freq: Four times a day (QID) | ORAL | 0 refills | Status: DC | PRN

## 2017-07-06 MED ORDER — ROSUVASTATIN CALCIUM 10 MG PO TABS: 10.0000 mg | ORAL_TABLET | Freq: Every day | ORAL | 0 refills | Status: DC

## 2017-07-06 NOTE — Progress Notes (Signed)
[facility]/Trauma Progress Note  1 Day Post-Op   Assessment/Plan Active Problems:   Abdominal pain, RUQ (right upper quadrant)   Cholecystitis, acute  - S/P lap chole, Dr. Ninfa Linden, 02/12  FEN: reg diet VTE: SCD's, lovenox ID: [email or web address] 02/11>> Foley: none Follow up: [facility] clinic 2 weeks  DISPO: Pt is okay for discharge from a surgical standpoint    LOS: 2 days    Subjective: CC: abdominal soreness  Pt has been up to urinate, walking the halls and had flatus. She had broth and ice cream last night that she tolerated. Has not had breakfast yet. She is ready to go home.   Objective: Vital signs in last 24 hours: Temp:  [97.4 F (36.3 C)-98.3 F (36.8 C)] 98.3 F (36.8 C) (02/13 0423) Pulse Rate:  [68-86] 68 (02/13 0423) Resp:  [14-18] 16 (02/13 0423) BP: (112-145)/(58-79) 113/59 (02/13 0423) SpO2:  [93 %-98 %] 98 % (02/13 0423) Last BM Date: 07/03/17  Intake/Output from previous day: 02/12 0701 - 02/13 0700 In: 2710 [P.O.:360; I.V.:1900; IV Piggyback:150] Out: 670 [Urine:650; Blood:20] Intake/Output this shift: No intake/output data recorded.  PE: Gen:  Alert, NAD, pleasant, cooperative Card:  RRR, no M/G/R heard Pulm:  CTA anteriorly, no W/R/R, effort normal Abd: Soft, NT/ND, +BS, incisions with glue intact are without drainage or surrounding erythema Skin: no rashes noted, warm and dry   Anti-infectives: Anti-infectives (From admission, onward)   Start     Dose/Rate Route Frequency Ordered Stop   07/04/17 2200  piperacillin-tazobactam (ZOSYN) IVPB 3.375 g     3.375 g 12.5 mL/hr over 240 Minutes Intravenous Every 8 hours 07/04/17 2105        Lab Results:  Recent Labs    07/05/17 0555 07/06/17 0545  WBC 7.3 15.6*  HGB 12.5 11.2*  HCT 37.4 34.0*  PLT 256 236   BMET Recent Labs    07/05/17 0555 07/06/17 0545  NA 141 140  K 3.7 4.1  CL 108 108  CO2 22 20*  GLUCOSE 102* 115*  BUN 8 7  CREATININE 0.82 0.78  CALCIUM 8.3* 8.0*    PT/INR No results for input(s): LABPROT, INR in the last 72 hours. CMP     Component Value Date/Time   NA 140 07/06/2017 0545   NA 145 (H) 09/17/2016 1001   K 4.1 07/06/2017 0545   CL 108 07/06/2017 0545   CO2 20 (L) 07/06/2017 0545   GLUCOSE 115 (H) 07/06/2017 0545   BUN 7 07/06/2017 0545   BUN 11 09/17/2016 1001   CREATININE 0.78 07/06/2017 0545   CALCIUM 8.0 (L) 07/06/2017 0545   PROT 5.2 (L) 07/06/2017 0545   PROT 6.7 09/17/2016 1001   ALBUMIN 2.9 (L) 07/06/2017 0545   ALBUMIN 4.4 09/17/2016 1001   AST 96 (H) 07/06/2017 0545   ALT 117 (H) 07/06/2017 0545   ALKPHOS 63 07/06/2017 0545   BILITOT 0.7 07/06/2017 0545   BILITOT <0.2 09/17/2016 1001   GFRNONAA >60 07/06/2017 0545   GFRAA >60 07/06/2017 0545   Lipase     Component Value Date/Time   LIPASE 34 07/04/2017 1821    Studies/Results: US Abdomen Limited Ruq  Result Date: 07/04/2017 CLINICAL DATA:  Inpatient.  Right upper quadrant abdominal pain. EXAM: ULTRASOUND ABDOMEN LIMITED RIGHT UPPER QUADRANT COMPARISON:  None. FINDINGS: Gallbladder Multiple layering calcified gallstones in gallbladder measuring up to 2.3 cm in size. Moderate diffuse gallbladder wall thickening up to 9 mm thickness. Sonographic Percell Miller sign is present. No pericholecystic fluid.  Common bile duct: Diameter: 3 mm Liver: Liver parenchyma is diffusely moderately echogenic with posterior acoustic attenuation, compatible with diffuse hepatic steatosis. No liver mass, noting decreased sensitivity in the setting of an echogenic liver. No definite liver surface irregularity. Portal vein is patent on color Doppler imaging with normal direction of blood flow towards the liver. IMPRESSION: 1. Cholelithiasis. Moderate diffuse gallbladder wall thickening. Sonographic Percell Miller sign is present. Findings are compatible with acute cholecystitis in the correct clinical setting. 2. No biliary ductal dilatation. 3. Diffuse hepatic steatosis. Electronically Signed   By:  Ilona Sorrel M.D.   On: 07/04/2017 19:20      Kalman Drape , [facility] Surgery 07/06/2017, 8:47 AM Pager: [phone] Consults: [phone] Mon-Fri 7:00 am-4:30 pm Sat-Sun 7:00 am-11:30 am

## 2017-07-06 NOTE — Progress Notes (Signed)
Pt discharged to home with her spouse.  Discharge instructions and prescriptions given.

## 2017-07-06 NOTE — Progress Notes (Signed)
FMTS Attending Daily Note:  S:  Feels much better today.   Exam:  Awake, alert, sitting on side of bed.   Head: [place]/AT.   Eyes:  EOMI, PERRL.   Mouth:  MMM, tonsils non-erythematous, non-edematous.   Cardiac:  Regular rate and rhythm without murmur auscultated.  Good S1/S2. Pulm:  Clear to auscultation bilaterally with good air movement.  No wheezes or rales noted.   Abd:  Minimal tenderness to palpation.  Much better than prior.  No RUQ pain.  Wounds healing well  Imp/Plan: 1.  Acute cholecystitis: - doing much better s/p surgery - ambulating and tolerating PO well.  - able to DC home today - appreciate surgery input.   2.  Transaminitis:  - noted on labs today.   - Most likely secondary to surgery - repeat at FU appt Monday - no RUQ pain/tenderness   3.  Leukocytosis:  - demargination from surgery - no signs of infection.  She looks and feels great.   4.  Hypoalbuminemia: - FU outpatient with PCP (me).  Wasn't eating as much due to gallbladder pain  5.  HLD: - will discuss need for statin at appt with me.    6.  Dispo: - ok to dc home today with LFT recheck in a few days.    Alveda Reasons, MD 07/06/2017 3:57 PM

## 2017-07-06 NOTE — Discharge Instructions (Signed)

## 2017-07-07 NOTE — Discharge Summary (Signed)
[facility] Discharge Summary  Patient name: [name] Medical record number: [id number] Date of birth: 18-Apr-1951 Age: 67 y.o. Gender: female Date of Admission: 07/04/2017  Date of Discharge: 07/06/2017 Admitting Physician: Leeanne Rio, MD  Primary Care Provider: Alveda Reasons, MD Consultants: Surgery  Indication for Hospitalization: Acute Cholecystitis  Discharge Diagnoses/Problem List:  RUQ abdominal pain, acute cholecystitis, s/p laparoscopic cholecystectomy Hyperlipidemia  Disposition: Home  Discharge Condition: Improved  Discharge Exam:  General: pleasant female in NAD, lying in bed Cardiovascular: RRR, no murmurs/rubs/gallops Respiratory: CTAB, no wheezes/rales/rhonchi, comfortable WOB on RA Abdomen: +BS, NTND, surgical sites clean, dry, and intact. Extremities: warm and well perfused, no LE edema  Brief Hospital Course:  Vilma Will Middletonis a 67 y.o.femalewith PMH significant for hyperlipidemia who presented with RUQ abdominal pain and vomiting in the setting of stain noncompliance. On admission, had positive Murphy's sign and abdominal ultrasound showing gallston with gallbladder wall thickening consistent with acute cholecystitis. General surgery was consulted who performed laparoscopic cholecystectomy 9/50 without complications. Patient remained on prophylactic IV antibiotics throughout this admission with stable vitals. Initial leukocytosis resolved after surgery. Patient's pain was managed with Oxycodone post-operatively but was not requiring pain medication prior to discharge. Vitals remained stable throughout admission. Patient tolerated a diet and is stable for discharge.  Issues for Follow Up:  1. Medication Changes: 1. Started Crestor 10mg  2. Patient will have post-op follow up on 2/26.  Significant Procedures: Laparoscopic Cholecystectomy  Significant Labs and Imaging:  Recent Labs  Lab 07/04/17 1821  07/05/17 0555 07/06/17 0545  WBC 12.4* 7.3 15.6*  HGB 14.0 12.5 11.2*  HCT 41.8 37.4 34.0*  PLT 288 256 236   Recent Labs  Lab 07/04/17 1821 07/05/17 0555 07/06/17 0545  NA 140 141 140  K 4.0 3.7 4.1  CL 105 108 108  CO2 24 22 20*  GLUCOSE 102* 102* 115*  BUN 8 8 7   CREATININE 0.81 0.82 0.78  CALCIUM 9.0 8.3* 8.0*  ALKPHOS 70 60 63  AST 23 25 96*  ALT 24 24 117*  ALBUMIN 3.8 3.2* 2.9*   2/11 US Abdomen IMPRESSION: 1. Cholelithiasis. Moderate diffuse gallbladder wall thickening. Sonographic Percell Miller sign is present. Findings are compatible with acute cholecystitis in the correct clinical setting. 2. No biliary ductal dilatation. 3. Diffuse hepatic steatosis.  Results/Tests Pending at Time of Discharge: None  Discharge Medications:  Allergies as of 07/06/2017   No Known Allergies     Medication List    TAKE these medications   aspirin EC 81 MG tablet Take 1 tablet (81 mg total) by mouth daily.   calcium carbonate 600 MG Tabs tablet Commonly known as:  CALCIUM 600 Take 1 tablet (600 mg total) by mouth 2 (two) times daily with a meal.   GLUCOSAMINE CHONDROITIN COMPLX Tabs Take 1 tablet by mouth daily.   MULTIVITAMIN ADULT Tabs Take 1 tablet by mouth daily.   OVER THE COUNTER MEDICATION Take 1 tablet by mouth daily. Energy and Metabolize Vitamin Complex   oxyCODONE 5 MG immediate release tablet Commonly known as:  Oxy IR/ROXICODONE Take 1 tablet (5 mg total) by mouth every 6 (six) hours as needed for moderate pain or severe pain.   risedronate 30 MG tablet Commonly known as:  ACTONEL Take 1 tablet (30 mg total) by mouth every 7 (seven) days. with water on empty stomach, nothing by mouth or lie down for next 30 minutes.   rosuvastatin 10 MG tablet Commonly known as:  CRESTOR  Take 1 tablet (10 mg total) by mouth daily at 6 PM.   Vitamin D3 2000 units Tabs Take 1 tablet by mouth daily.   ZYRTEC ALLERGY 10 MG tablet Generic drug:  cetirizine Take 10 mg  by mouth daily.       Discharge Instructions: Please refer to Patient Instructions section of EMR for full details.  Patient was counseled important signs and symptoms that should prompt return to medical care, changes in medications, dietary instructions, activity restrictions, and follow up appointments.   Follow-Up Appointments: Follow-up [facility], [place]. Go on 07/19/2017.   Specialty:  General Surgery Why:  at 10:30 am. Please arrive 30 min prior to complete paperwork. Please bring photo ID and insurance card Contact information: [place] Follow up on 07/11/2017.   Why:  @ 9:15am Contact information: [address]          Rory Percy, DO 07/07/2017, 2:02 PM PGY-1, [place]

## 2017-07-11 ENCOUNTER — Encounter: Payer: Self-pay | Admitting: Family Medicine

## 2017-07-11 ENCOUNTER — Ambulatory Visit: Payer: BLUE CROSS/BLUE SHIELD | Admitting: Family Medicine

## 2017-07-11 ENCOUNTER — Other Ambulatory Visit: Payer: Self-pay

## 2017-07-11 VITALS — BP 122/64 | HR 68 | Temp 98.0°F | Ht 63.0 in | Wt 137.6 lb

## 2017-07-11 DIAGNOSIS — Z4889 Encounter for other specified surgical aftercare: Secondary | ICD-10-CM | POA: Diagnosis not present

## 2017-07-11 DIAGNOSIS — Z9049 Acquired absence of other specified parts of digestive tract: Secondary | ICD-10-CM | POA: Diagnosis not present

## 2017-07-11 NOTE — Patient Instructions (Signed)
It was a pleasure to see you today! Thank you for choosing [facility] for your primary care. [name] was seen for hospital followup after cholecystectomy. Come back to the clinic if you develop and new concerns, and go to the emergency room if you have any life threatening symptoms.  Today we examined your wounds and discussed how you have felt since surgery.   It seems you are healing quite well.   You should continue and keep all your surgical followup appts.   If it doesn't hurt you should be ok to start some light vacuuming like you requested.  If we did any lab work today, and the results require attention, either me or my nurse will get in touch with you. If everything is normal, you will get a letter in mail and a message via . If you don't hear from [place] in two weeks, please give [place] a call. Otherwise, we look forward to seeing you again at your next visit. If you have any questions or concerns before then, please call the clinic at [phone].  Please bring all your medications to every doctors visit  Sign up for My Chart to have easy access to your labs results, and communication with your Primary care physician.    Please check-out at the front desk before leaving the clinic.    Best,  Dr. Sherene Sires FAMILY MEDICINE RESIDENT - PGY1 07/11/2017 9:12 AM

## 2017-07-11 NOTE — Assessment & Plan Note (Signed)
Healing as expected.  Patient may vacuum lightly as tolerated.  She is being instructed to keep followup appts with surgery.   No request for additional pain meds.

## 2017-07-11 NOTE — Progress Notes (Signed)
    Subjective:  [name] is a 67 y.o. female who presents to the [facility] today with a chief complaint of post cholecystectomy hospital followup and wound check.   HPI: Patient said surgery went without complication and she has been healing as well as she could have hoped with no trouble with pain/eating/bowel movement/urination/ambulation.   She has had no chest pain/SOB/fevers or significant increases in pain.  She has had no problems with bleeding and only minor bruising at sites of incision.  She would like permission to vacuum because she has been very compliant with restricted activity directions.   Objective:  Physical Exam: BP 122/64   Pulse 68   Temp 98 F (36.7 C) (Oral)   Ht 5\' 3"  (1.6 m)   Wt 137 lb 9.6 oz (62.4 kg)   SpO2 99%   BMI 24.37 kg/m   Gen: NAD, conversing comfortably, able to get onto exam table and down with no assistance CV: RRR with no murmurs appreciated Pulm: NWOB, CTAB with no crackles, wheezes, or rhonchi GI: Normal bowel sounds present. Soft, Nontender except for minor tenderness at incision points, Nondistended.  4 total wounds (includiing umbilicus) with no bleeding/drainage/erythema.   Minor bruising only. MSK: no edema, cyanosis, or clubbing noted Skin: warm, dry Neuro: grossly normal, moves all extremities Psych: Normal affect and thought content  No results found for this or any previous visit (from the past 72 hour(s)).   Assessment/Plan:  Encounter for post surgical wound check Healing as expected.  Patient may vacuum lightly as tolerated.  She is being instructed to keep followup appts with surgery.   No request for additional pain meds.  Status post cholecystectomy Healing well.   Will keep f/u with surgery   Sherene Sires, [place] - PGY1 07/11/2017 10:51 AM

## 2017-07-11 NOTE — Assessment & Plan Note (Signed)
Healing well.   Will keep f/u with surgery

## 2017-07-29 ENCOUNTER — Other Ambulatory Visit: Payer: Self-pay

## 2017-08-01 MED ORDER — ROSUVASTATIN CALCIUM 10 MG PO TABS: 10.0000 mg | ORAL_TABLET | Freq: Every day | ORAL | 1 refills | Status: DC

## 2017-08-01 MED ORDER — ASPIRIN EC 81 MG PO TBEC: 81.0000 mg | DELAYED_RELEASE_TABLET | Freq: Every day | ORAL | 1 refills | Status: DC

## 2017-08-12 ENCOUNTER — Other Ambulatory Visit: Payer: Self-pay

## 2017-08-12 DIAGNOSIS — M810 Age-related osteoporosis without current pathological fracture: Secondary | ICD-10-CM

## 2017-08-12 MED ORDER — RISEDRONATE SODIUM 30 MG PO TABS: 30.0000 mg | ORAL_TABLET | ORAL | 11 refills | Status: DC

## 2017-08-24 ENCOUNTER — Encounter: Payer: Self-pay | Admitting: Family Medicine

## 2017-10-14 ENCOUNTER — Encounter: Payer: Self-pay | Admitting: Family Medicine

## 2017-10-14 ENCOUNTER — Telehealth: Payer: Self-pay

## 2017-10-14 ENCOUNTER — Other Ambulatory Visit: Payer: Self-pay

## 2017-10-14 ENCOUNTER — Ambulatory Visit (INDEPENDENT_AMBULATORY_CARE_PROVIDER_SITE_OTHER): Payer: BLUE CROSS/BLUE SHIELD | Admitting: Family Medicine

## 2017-10-14 VITALS — BP 118/78 | HR 67 | Temp 97.8°F | Ht 63.0 in | Wt 140.0 lb

## 2017-10-14 DIAGNOSIS — Z0000 Encounter for general adult medical examination without abnormal findings: Secondary | ICD-10-CM | POA: Diagnosis not present

## 2017-10-14 DIAGNOSIS — E785 Hyperlipidemia, unspecified: Secondary | ICD-10-CM | POA: Diagnosis not present

## 2017-10-14 NOTE — Telephone Encounter (Signed)
Pt left message on nurse line requesting a call back from PCP regarding todays visit. Call back number [phone] Wallace Cullens, RN

## 2017-10-14 NOTE — Patient Instructions (Signed)
It was very good to see you again today!  Overall, everything looks very good.    We are checking some labs today.   As of right now, there's no need for you to take a daily baby aspirin.    Come back if you have any questions or concerns -- otherwise I will see you in a year.

## 2017-10-14 NOTE — Progress Notes (Signed)
[name] is a 67 y.o. female who presents to [facility] today for comprehensive physical examination:  CPE  Concerns: Mostly her concerns revolve around her husband.  She is concerned because of some personality changes in the past year or so mainly around anger and frustration over his inability to retain short-term memory.  She states that he he can "flip" easily and she never knows what mood is in or will be in.  She generally leaves the area if he begins to become derogatory or curse around her.  It sounds like she is also set some good boundaries that she will not allow him to speak to her in a derogatory manner.  Last physical last year.  She has had these yearly. Tetanus up-to-date Eye exam: Up-to-date Dental exam every six months.   PMH reviewed. Patient is a nonsmoker.   Past Medical History:  Diagnosis Date  . Postmenopausal hormone replacement therapy    Past Surgical History:  Procedure Laterality Date  . CHOLECYSTECTOMY N/A 07/05/2017   Procedure: LAPAROSCOPIC CHOLECYSTECTOMY;  Surgeon: Coralie Keens, MD;  Location: [place];  Service: General;  Laterality: N/A;  . FRACTURE SURGERY    . SHOULDER ARTHROSCOPY Left 1990s   "frozen shoulder"  . TIBIA FRACTURE SURGERY  2009   rod placed   . TONSILLECTOMY    . TOTAL ABDOMINAL HYSTERECTOMY N/A ~ 2000  . TUBAL LIGATION N/A ~ 1995    Medications reviewed. Current Outpatient Medications  Medication Sig Dispense Refill  . aspirin EC 81 MG tablet Take 1 tablet (81 mg total) by mouth daily. 100 tablet 1  . calcium carbonate (CALCIUM 600) 600 MG TABS tablet Take 1 tablet (600 mg total) by mouth 2 (two) times daily with a meal. 60 tablet 1  . cetirizine (ZYRTEC ALLERGY) 10 MG tablet Take 10 mg by mouth daily.    . Cholecalciferol (VITAMIN D3) 2000 units TABS Take 1 tablet by mouth daily. 30 tablet 2  . Multiple Vitamins-Minerals (MULTIVITAMIN ADULT) TABS Take 1 tablet by mouth daily. 100 tablet 1    . OVER THE COUNTER MEDICATION Take 1 tablet by mouth daily. Energy and Metabolize Vitamin Complex    . risedronate (ACTONEL) 30 MG tablet Take 1 tablet (30 mg total) by mouth every 7 (seven) days. with water on empty stomach, nothing by mouth or lie down for next 30 minutes. 4 tablet 11  . rosuvastatin (CRESTOR) 10 MG tablet Take 1 tablet (10 mg total) by mouth daily at 6 PM. 90 tablet 1   No current facility-administered medications for this visit.     Social: Smoking history:  n/a Alcohol use:  n/a Illicit drug use:  n/a Relationship status:   married Occupation:  Works part-time at tax office  Family History: Mother with history of thyroid disease and osteoporosis.  Father with history of prostate cancer and CVA.  Review of Systems  Constitutional: Negative for fever.  HENT: Negative for congestion, ear discharge, ear pain and hearing loss.   Eyes: Negative for blurred vision.  Respiratory: Negative for cough and wheezing.   Cardiovascular: Negative for chest pain, palpitations and leg swelling.  Gastrointestinal: Negative for nausea, vomiting and abdominal pain.  Genitourinary: Negative for dysuria, hematuria and flank pain.  Musculoskeletal: Negative for neck pain.  Skin: Negative for rash.  Neurological: Negative for dizziness and headaches.  Psychiatric/Behavioral: Negative for depression and suicidal ideas.   Exam: BP 118/78   Pulse 67   Temp 97.8 F (  36.6 C) (Oral)   Ht 5\' 3"  (1.6 m)   Wt 140 lb (63.5 kg)   SpO2 98%   BMI 24.80 kg/m  Gen:  Alert, cooperative patient who appears stated age in no acute distress.  Vital signs reviewed. Head: [place]/AT.   Eyes:  EOMI, PERRL.   Ears:  External ears WNL, Bilateral TM's normal without retraction, redness or bulging. Nose:  Septum midline  Mouth:  MMM, tonsils non-erythematous, non-edematous.   Neck: No masses or thyromegaly or limitation in range of motion.  No cervical lymphadenopathy. Pulm:  Clear to auscultation  bilaterally with good air movement.  No wheezes or rales noted.   Cardiac:  Regular rate and rhythm without murmur auscultated.  Good S1/S2. Abd:  Soft/nondistended/nontender.  Good bowel sounds throughout all four quadrants.  No masses noted.  Ext:  No clubbing/cyanosis/erythema.  No edema noted bilateral lower extremities.   Neuro:  Grossly normal, no gait abnormalities Psych:  Not depressed or anxious appearing.  Conversant and engaged  Impression/Plan: 1. Complete Physical Examination: anticipatory guidance provided.  2. Screening cholesterol: obtain FLP. -Sounds like she is personally doing well from the standpoint of working with her husband through some of his difficulties.  Discussed that if she needs any help or coaching to reach back out.  She feels safe in her relationship. -Checking labs today as above. -Follow-up in 1 year or sooner if any questions or concerns - low ASCVD risk.  Can stop ppx ASA

## 2017-10-15 LAB — LIPID PANEL
CHOL/HDL RATIO: 2.9 ratio (ref 0.0–4.4)
Cholesterol, Total: 174 mg/dL (ref 100–199)
HDL: 59 mg/dL (ref >39)
LDL Calculated: 87 mg/dL (ref 0–99)
Triglycerides: 141 mg/dL (ref 0–149)
VLDL CHOLESTEROL CAL: 28 mg/dL (ref 5–40)

## 2017-10-15 LAB — COMPREHENSIVE METABOLIC PANEL
A/G RATIO: 1.9 (ref 1.2–2.2)
ALBUMIN: 5 g/dL — AB (ref 3.6–4.8)
ALT: 31 IU/L (ref 0–32)
AST: 21 IU/L (ref 0–40)
Alkaline Phosphatase: 74 IU/L (ref 39–117)
BILIRUBIN TOTAL: 0.4 mg/dL (ref 0.0–1.2)
BUN / CREAT RATIO: 19 (ref 12–28)
BUN: 14 mg/dL (ref 8–27)
CALCIUM: 10.3 mg/dL (ref 8.7–10.3)
CHLORIDE: 103 mmol/L (ref 96–106)
CO2: 24 mmol/L (ref 20–29)
Creatinine, Ser: 0.75 mg/dL (ref 0.57–1.00)
GFR, EST AFRICAN AMERICAN: 97 mL/min/{1.73_m2} (ref >59)
GFR, EST NON AFRICAN AMERICAN: 85 mL/min/{1.73_m2} (ref >59)
Globulin, Total: 2.6 g/dL (ref 1.5–4.5)
Glucose: 94 mg/dL (ref 65–99)
POTASSIUM: 5 mmol/L (ref 3.5–5.2)
Sodium: 143 mmol/L (ref 134–144)
TOTAL PROTEIN: 7.6 g/dL (ref 6.0–8.5)

## 2017-10-15 LAB — CBC
HEMATOCRIT: 44.9 % (ref 34.0–46.6)
HEMOGLOBIN: 15.1 g/dL (ref 11.1–15.9)
MCH: 29.3 pg (ref 26.6–33.0)
MCHC: 33.6 g/dL (ref 31.5–35.7)
MCV: 87 fL (ref 79–97)
Platelets: 314 10*3/uL (ref 150–450)
RBC: 5.16 x10E6/uL (ref 3.77–5.28)
RDW: 13.7 % (ref 12.3–15.4)
WBC: 6.9 10*3/uL (ref 3.4–10.8)

## 2017-10-15 NOTE — Telephone Encounter (Signed)
No answer left voicemail to call back or send mychart message

## 2017-10-16 ENCOUNTER — Encounter: Payer: Self-pay | Admitting: Family Medicine

## 2017-11-01 ENCOUNTER — Other Ambulatory Visit: Payer: Self-pay | Admitting: Family Medicine

## 2018-01-24 ENCOUNTER — Ambulatory Visit (INDEPENDENT_AMBULATORY_CARE_PROVIDER_SITE_OTHER): Payer: BLUE CROSS/BLUE SHIELD | Admitting: *Deleted

## 2018-01-24 DIAGNOSIS — Z23 Encounter for immunization: Secondary | ICD-10-CM | POA: Diagnosis not present

## 2018-03-23 DIAGNOSIS — R69 Illness, unspecified: Secondary | ICD-10-CM | POA: Diagnosis not present

## 2018-03-23 IMAGING — US US ABDOMEN LIMITED
1 series · 14 of 25 positions shown · non-contrast
Comparison: None.

CLINICAL DATA: Inpatient.  Right upper quadrant abdominal pain.

EXAM:
ULTRASOUND ABDOMEN LIMITED RIGHT UPPER QUADRANT

[Series 1: us abdomen limited · 0.25mm/px · 14 of 36 slices shown]
[im 1/36]
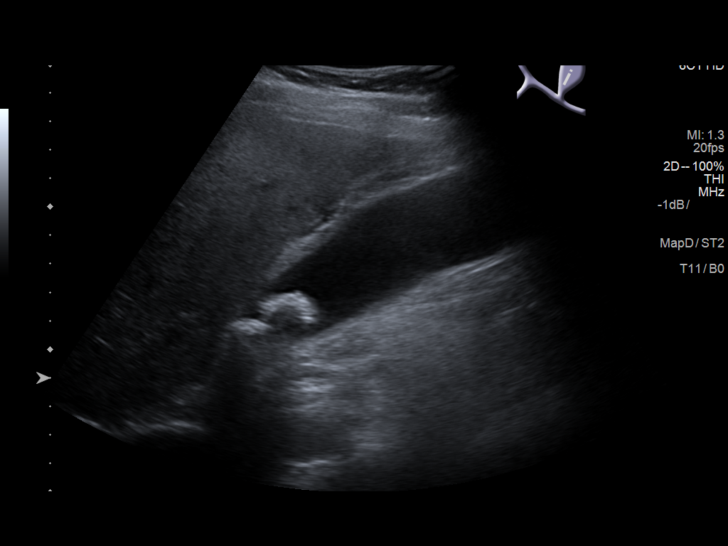
[im 3/36]
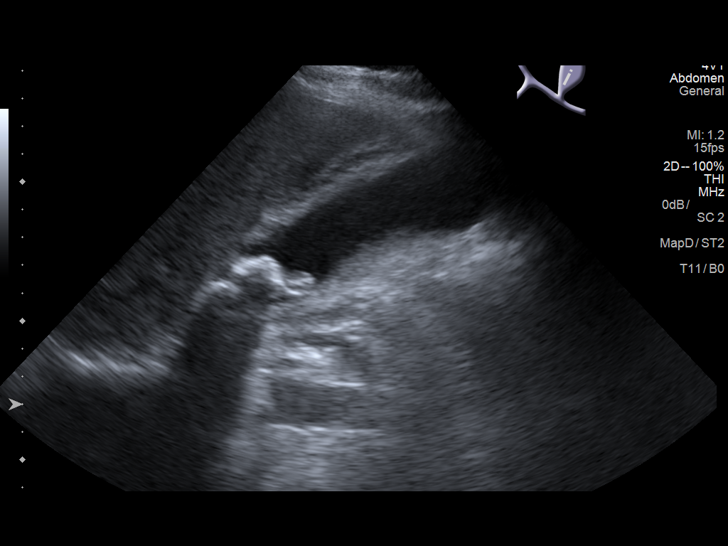
[im 6/36]
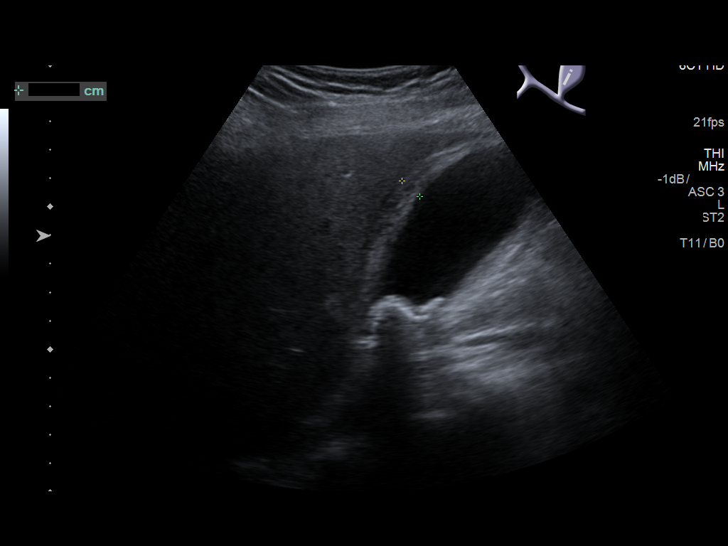
[im 9/36]
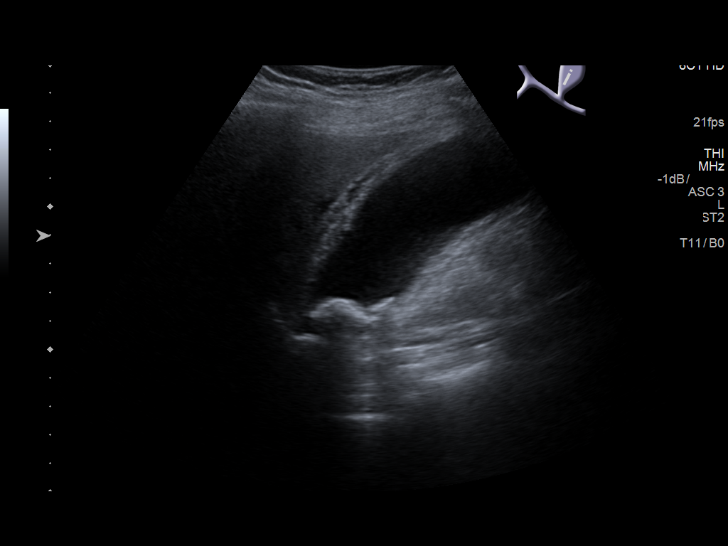
[im 12/36]
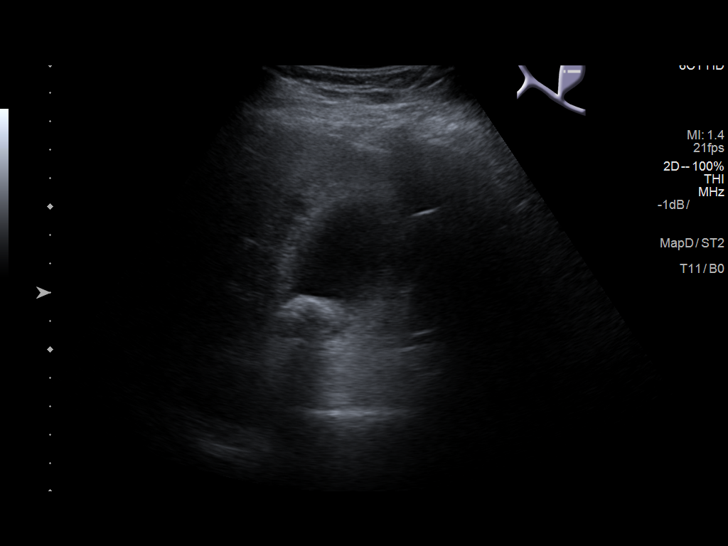
[im 14/36]
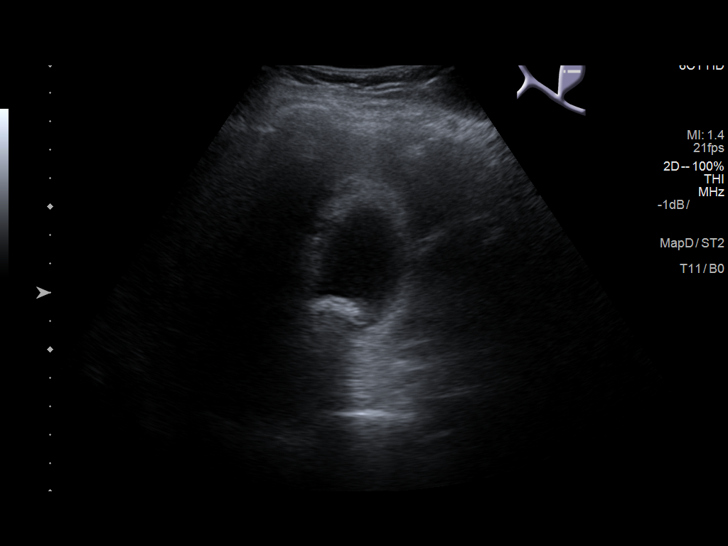
[im 17/36]
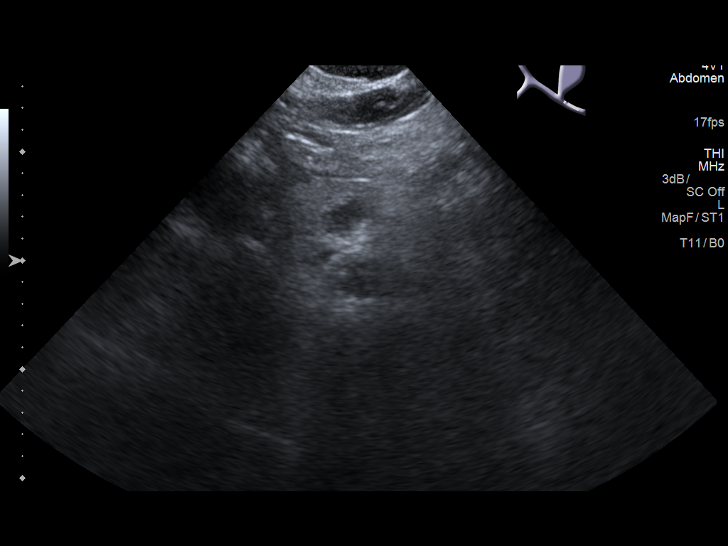
[im 19/36]
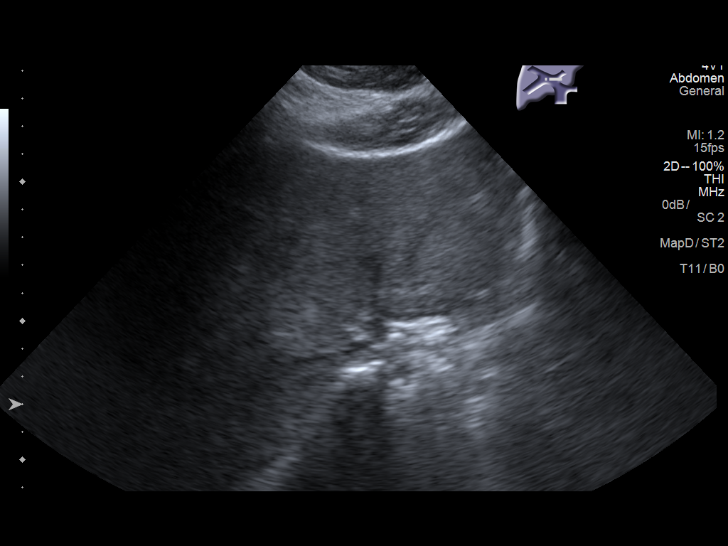
[im 22/36]
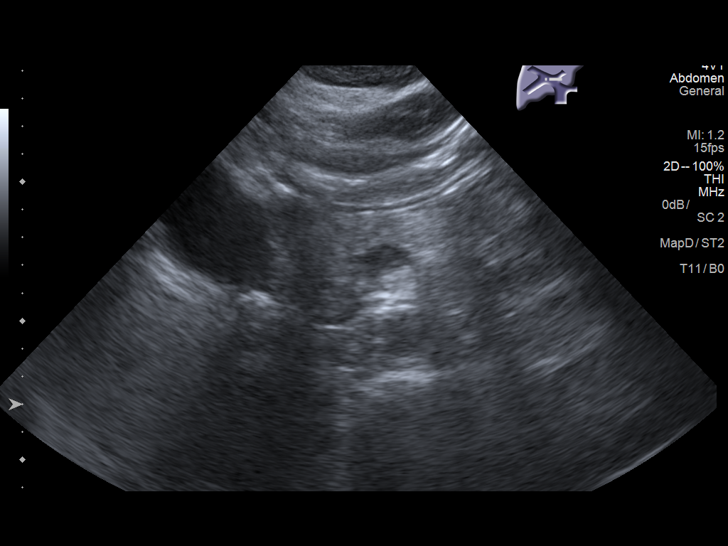
[im 24/36]
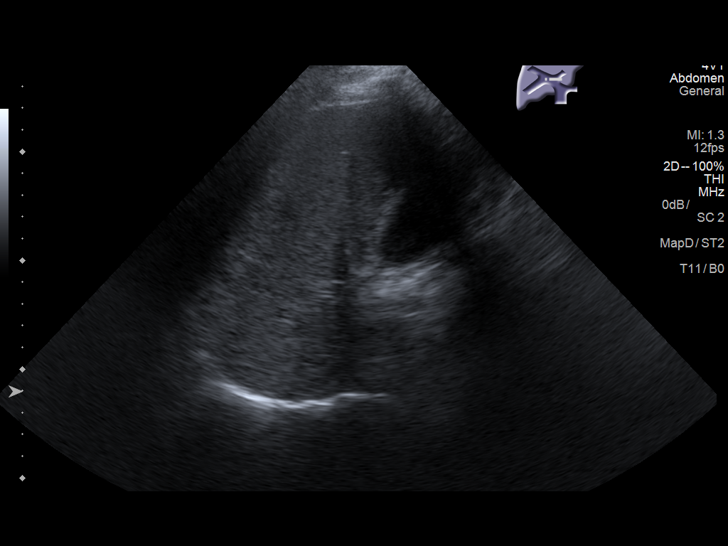
[im 27/36]
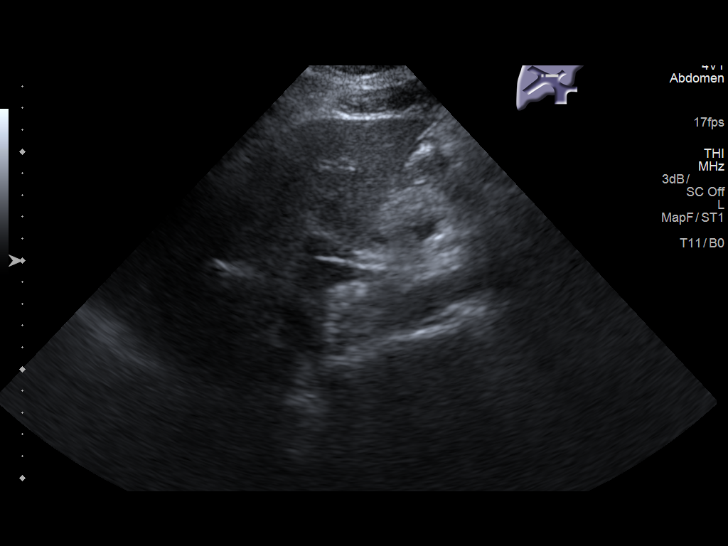
[im 30/36]
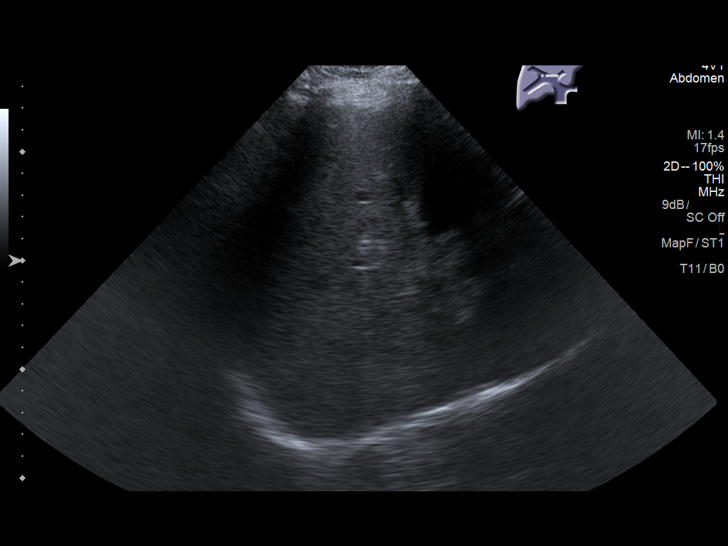
[im 33/36]
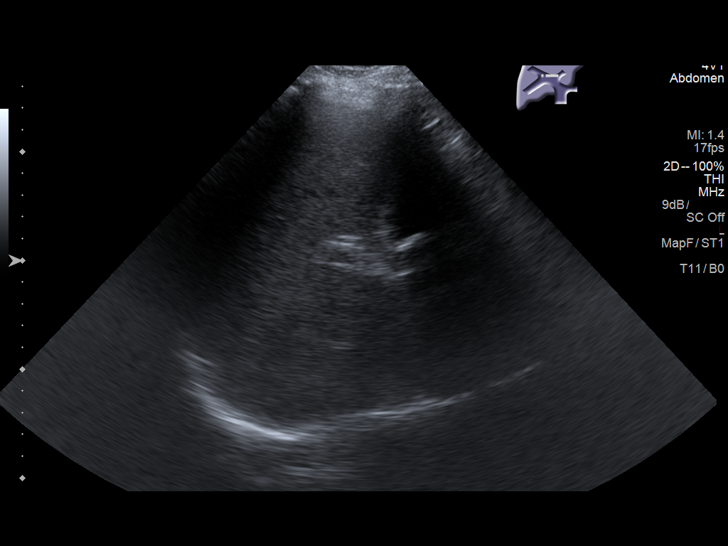
[im 36/36]
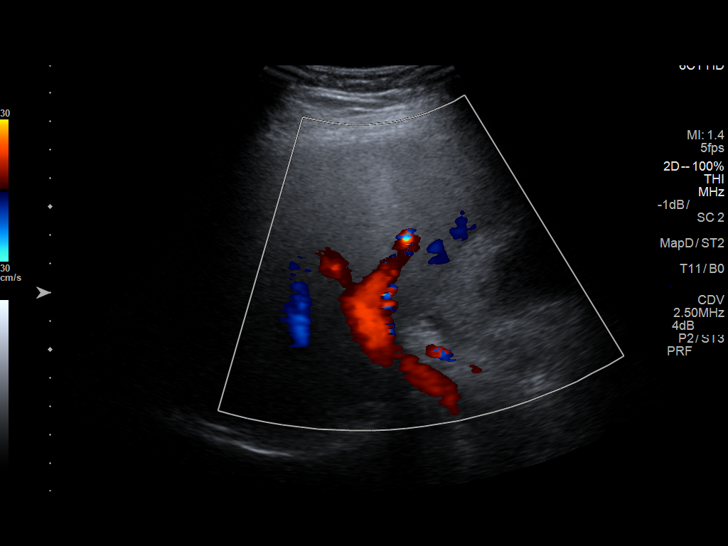

[14 of 25 positions shown; findings below may reference images not displayed]

FINDINGS: Gallbladder

Multiple layering calcified gallstones in gallbladder measuring up
to 2.3 cm in size. Moderate diffuse gallbladder wall thickening up
to 9 mm thickness. Sonographic Murphy sign is present. No
pericholecystic fluid.

Common bile duct:

Diameter: 3 mm

Liver:

Liver parenchyma is diffusely moderately echogenic with posterior
acoustic attenuation, compatible with diffuse hepatic steatosis. No
liver mass, noting decreased sensitivity in the setting of an
echogenic liver. No definite liver surface irregularity. Portal vein
is patent on color Doppler imaging with normal direction of blood
flow towards the liver.
IMPRESSION: 1. Cholelithiasis. Moderate diffuse gallbladder wall thickening.
Sonographic Murphy sign is present. Findings are compatible with
acute cholecystitis in the correct clinical setting.
2. No biliary ductal dilatation.
3. Diffuse hepatic steatosis.

## 2018-04-11 DIAGNOSIS — R194 Change in bowel habit: Secondary | ICD-10-CM | POA: Diagnosis not present

## 2018-04-11 DIAGNOSIS — Z90710 Acquired absence of both cervix and uterus: Secondary | ICD-10-CM | POA: Diagnosis not present

## 2018-04-11 DIAGNOSIS — Z8262 Family history of osteoporosis: Secondary | ICD-10-CM | POA: Diagnosis not present

## 2018-04-11 DIAGNOSIS — K5904 Chronic idiopathic constipation: Secondary | ICD-10-CM | POA: Diagnosis not present

## 2018-04-11 DIAGNOSIS — Z1211 Encounter for screening for malignant neoplasm of colon: Secondary | ICD-10-CM | POA: Diagnosis not present

## 2018-04-11 DIAGNOSIS — Z1231 Encounter for screening mammogram for malignant neoplasm of breast: Secondary | ICD-10-CM | POA: Diagnosis not present

## 2018-04-11 DIAGNOSIS — M8589 Other specified disorders of bone density and structure, multiple sites: Secondary | ICD-10-CM | POA: Diagnosis not present

## 2018-05-03 ENCOUNTER — Ambulatory Visit: Payer: BLUE CROSS/BLUE SHIELD | Admitting: Family Medicine

## 2018-05-09 ENCOUNTER — Ambulatory Visit (INDEPENDENT_AMBULATORY_CARE_PROVIDER_SITE_OTHER): Payer: Medicare HMO | Admitting: Family Medicine

## 2018-05-09 ENCOUNTER — Other Ambulatory Visit: Payer: Self-pay

## 2018-05-09 ENCOUNTER — Encounter: Payer: Self-pay | Admitting: Family Medicine

## 2018-05-09 DIAGNOSIS — Z23 Encounter for immunization: Secondary | ICD-10-CM

## 2018-05-09 DIAGNOSIS — M8589 Other specified disorders of bone density and structure, multiple sites: Secondary | ICD-10-CM | POA: Diagnosis not present

## 2018-05-09 NOTE — Progress Notes (Signed)
Subjective:    [name] is a 67 y.o. female who presents to [facility] today for discussion about need for bisphosphanates:  1.  Osteopenia:  Long-term issue for patient.  She is already s/p 5 years of bisphosphonate therapy.  Prior PCP obtained Dexa which showed osteopenia and she was started on risendronate.  Has been taking the regularly.  Last dose today.  Wonders if she continues to need this.  Would prefer not to and continue calcium supplementation plus weight bearing exercise.    Brings Dexa scan with her from [place], as well as normal mammogram.  Her T scores for spine and left femoral neck are essentially unchanged.  Still with osteopenia.    No falls, no compression fractures, no pain.  Feels well overall.    ROS as above per HPI.    The following portions of the patient's history were reviewed and updated as appropriate: allergies, current medications, past medical history, family and social history, and problem list. Patient is a nonsmoker.    PMH reviewed.  Past Medical History:  Diagnosis Date  . Postmenopausal hormone replacement therapy    Past Surgical History:  Procedure Laterality Date  . CHOLECYSTECTOMY N/A 07/05/2017   Procedure: LAPAROSCOPIC CHOLECYSTECTOMY;  Surgeon: Coralie Keens, MD;  Location: [place];  Service: General;  Laterality: N/A;  . FRACTURE SURGERY    . SHOULDER ARTHROSCOPY Left 1990s   "frozen shoulder"  . TIBIA FRACTURE SURGERY  2009   rod placed   . TONSILLECTOMY    . TOTAL ABDOMINAL HYSTERECTOMY N/A ~ 2000  . TUBAL LIGATION N/A ~ 1995    Medications reviewed. Current Outpatient Medications  Medication Sig Dispense Refill  . aspirin EC 81 MG tablet Take 1 tablet (81 mg total) by mouth daily. 100 tablet 1  . calcium carbonate (CALCIUM 600) 600 MG TABS tablet Take 1 tablet (600 mg total) by mouth 2 (two) times daily with a meal. 60 tablet 1  . cetirizine (ZYRTEC ALLERGY) 10 MG tablet Take 10 mg by mouth daily.    . Cholecalciferol  (VITAMIN D3) 2000 units TABS Take 1 tablet by mouth daily. 30 tablet 2  . Multiple Vitamins-Minerals (MULTIVITAMIN ADULT) TABS Take 1 tablet by mouth daily. 100 tablet 1  . OVER THE COUNTER MEDICATION Take 1 tablet by mouth daily. Energy and Metabolize Vitamin Complex    . risedronate (ACTONEL) 30 MG tablet Take 1 tablet (30 mg total) by mouth every 7 (seven) days. with water on empty stomach, nothing by mouth or lie down for next 30 minutes. 4 tablet 11  . rosuvastatin (CRESTOR) 10 MG tablet TAKE 1 TABLET (10 MG TOTAL) BY MOUTH DAILY AT 6 PM. 90 tablet 1   No current facility-administered medications for this visit.      Objective:   Physical Exam BP 124/66   Pulse 64   Temp 97.8 F (36.6 C) (Oral)   Ht 5\' 3"  (1.6 m)   Wt 140 lb (63.5 kg)   SpO2 97%   BMI 24.80 kg/m  Gen:  Alert, cooperative patient who appears stated age in no acute distress.  Vital signs reviewed. HEENT: EOMI,  MMM Cardiac:  Regular rate and rhythm without murmur auscultated.  Good S1/S2. Pulm:  Clear to auscultation bilaterally with good air movement.  No wheezes or rales noted.     No results found for this or any previous visit (from the past 72 hour(s)).

## 2018-05-09 NOTE — Patient Instructions (Signed)
It was good to see you again today.  Bring the reports from [facility] for both your mammogram and bone density scans.  Will make a decision at that point about 80 per medication.  Pneumonia shot today.  Everything else looks good.  Have a great Christmas!

## 2018-05-10 ENCOUNTER — Encounter: Payer: Self-pay | Admitting: Family Medicine

## 2018-05-10 DIAGNOSIS — M8580 Other specified disorders of bone density and structure, unspecified site: Secondary | ICD-10-CM | POA: Insufficient documentation

## 2018-05-10 NOTE — Assessment & Plan Note (Signed)
Patient centered discussion and decision making.  She is very hesitant about restarting medical therapy.  - Plan will be to continue weight bearing exercise.   - Recheck T-scores with Dexa in 2 years.  Patient agreeable with this plan

## 2018-05-12 DIAGNOSIS — Z86010 Personal history of colonic polyps: Secondary | ICD-10-CM | POA: Diagnosis not present

## 2018-05-12 DIAGNOSIS — D120 Benign neoplasm of cecum: Secondary | ICD-10-CM | POA: Diagnosis not present

## 2018-05-12 DIAGNOSIS — K635 Polyp of colon: Secondary | ICD-10-CM | POA: Diagnosis not present

## 2018-05-12 DIAGNOSIS — Z1211 Encounter for screening for malignant neoplasm of colon: Secondary | ICD-10-CM | POA: Diagnosis not present

## 2018-06-01 ENCOUNTER — Encounter: Payer: Self-pay | Admitting: Family Medicine

## 2018-06-15 ENCOUNTER — Encounter: Payer: Self-pay | Admitting: Family Medicine

## 2018-07-10 ENCOUNTER — Telehealth: Payer: Self-pay | Admitting: Family Medicine

## 2018-07-10 NOTE — Telephone Encounter (Signed)
Opened in error

## 2018-07-31 ENCOUNTER — Telehealth: Payer: Self-pay | Admitting: *Deleted

## 2018-07-31 NOTE — Telephone Encounter (Signed)
Pt was exposed to influenza B by grandson this weekend.  He tested positive yesterday @ an urgent care and MD suggested that they ask PCP for tamiflu.   Pt is asymptomatic and did have flu shot this year.   Will forward to MD for next step. [place], Salome Spotted, CMA

## 2018-08-02 MED ORDER — OSELTAMIVIR PHOSPHATE 75 MG PO CAPS: 75.0000 mg | ORAL_CAPSULE | Freq: Every day | ORAL | 0 refills | Status: DC

## 2018-08-11 ENCOUNTER — Other Ambulatory Visit: Payer: Self-pay | Admitting: Family Medicine

## 2018-10-19 DIAGNOSIS — D225 Melanocytic nevi of trunk: Secondary | ICD-10-CM | POA: Diagnosis not present

## 2018-10-19 DIAGNOSIS — L814 Other melanin hyperpigmentation: Secondary | ICD-10-CM | POA: Diagnosis not present

## 2018-10-19 DIAGNOSIS — L821 Other seborrheic keratosis: Secondary | ICD-10-CM | POA: Diagnosis not present

## 2018-10-19 DIAGNOSIS — D1801 Hemangioma of skin and subcutaneous tissue: Secondary | ICD-10-CM | POA: Diagnosis not present

## 2018-10-19 DIAGNOSIS — L820 Inflamed seborrheic keratosis: Secondary | ICD-10-CM | POA: Diagnosis not present

## 2018-10-19 DIAGNOSIS — L578 Other skin changes due to chronic exposure to nonionizing radiation: Secondary | ICD-10-CM | POA: Diagnosis not present

## 2018-11-27 ENCOUNTER — Encounter: Payer: Self-pay | Admitting: Family Medicine

## 2018-11-27 ENCOUNTER — Ambulatory Visit (INDEPENDENT_AMBULATORY_CARE_PROVIDER_SITE_OTHER): Payer: Medicare HMO | Admitting: Family Medicine

## 2018-11-27 ENCOUNTER — Ambulatory Visit: Payer: Medicare HMO | Admitting: Family Medicine

## 2018-11-27 ENCOUNTER — Other Ambulatory Visit: Payer: Self-pay

## 2018-11-27 VITALS — BP 118/80 | HR 68 | Wt 140.6 lb

## 2018-11-27 DIAGNOSIS — M8580 Other specified disorders of bone density and structure, unspecified site: Secondary | ICD-10-CM

## 2018-11-27 DIAGNOSIS — E785 Hyperlipidemia, unspecified: Secondary | ICD-10-CM | POA: Diagnosis not present

## 2018-11-27 DIAGNOSIS — M859 Disorder of bone density and structure, unspecified: Secondary | ICD-10-CM | POA: Diagnosis not present

## 2018-11-27 MED ORDER — FAMOTIDINE 20 MG PO TABS: 20.0000 mg | ORAL_TABLET | Freq: Two times a day (BID) | ORAL | 3 refills | Status: DC

## 2018-11-27 NOTE — Progress Notes (Signed)
  Subjective:  Patient ID: [name]  DOB: 1950-08-01 MRN: [id number]  [name] is a 68 y.o. female with a PMH of osteopenia, here today for annual physical/check-up.   HPI:She has generally been in her usual state of health with the following minor complaints:  Cough: She reports a several year history of chronic cough that has been worsening in recent weeks. She has been told in the past that this was likely due to post-nasal drip and has been using Flonase and cetirizine for the past year with no relief in symptoms. Cough is productive of small amounts of clear phlegm, but she denies any blood or purulent sputum. No fevers. She endorses nighttime heartburn but denies any perceived association of her cough with food.   Skin lesion: She states that a lesion on her L arm has recently become pruritic. She denies any bleeding or scaling of the lesion and states that she is followed by a dermatologist, but that she had not brought this particular lesion to the dermatologist's attention. She acknowledges high levels of sun exposure, but endorses regular sunscreen use.   Health Maintenance: She is up-to-date on all CA screening and reports regular dental visits and eye exams. She had a 13 valent pneumonia vaccine in December and is asking if she is due for the 23 valent vaccine at this time.   She is also requesting that we draw a Vit D level. Her last Vit D was in 2018 and was normal at 44.1 ng/mL  Has dentist Postmenopausal Advanced directives - would not want prolonged life support without any hope of meaningful recovery  Social hx: Denies use of illicit drugs, reports only occasional alcohol use Smoking status reviewed: never smoker  Review of Systems  Per HPI  Objective:  BP 118/80   Pulse 68   Wt 140 lb 9.6 oz (63.8 kg)   SpO2 98%   BMI 24.91 kg/m   Vitals and nursing note reviewed  General: NAD, pleasant Cardiac: RRR, normal heart sounds, no m/r/g Pulm: normal  effort, CTAB GI: soft, nontender, nondistended Extremities: no edema or cyanosis. WWP. Skin: ~1cm hypopigmented plaque with well-defined borders present on L forearm  Neuro: alert and oriented, no focal deficits Psych: normal affect, normal thought content  Assessment & Plan:   #Cough: Given the lack of response to cetirizine and Flonase and her recent history of nighttime heartburn, I think that her cough is likely caused by GERD. Given her history of osteopenia would ideally avoid PPI if possible. We will therefore try famotidine as a first-line agent. Instructed her to follow-up if her symptoms do not resolve.   #Skin lesion: Encouraged her to follow-up with dermatology at her next visit in November, sooner if changes persist.  #Health maintenance: Informed her that she is not due for her next pneumonia vaccine until January 2021. Will draw lipids, CMP, and vitamin D today.    [place] Student  Patient seen along with medical student Pearla Dubonnet. I personally evaluated this patient along with the student, and verified all aspects of the history, physical exam, and medical decision making as documented by the student. I agree with the student's documentation and have made all necessary edits.  Chrisandra Netters, MD [place]

## 2018-11-27 NOTE — Patient Instructions (Signed)
Try pepcid for acid reflux - let's see if that helps with cough  Labs today  Will need pneumonia shot in January  Be well, Dr. Ardelia Mems    Health Maintenance After Age 66 After age 53, you are at a higher risk for certain long-term diseases and infections as well as injuries from falls. Falls are a major cause of broken bones and head injuries in people who are older than age 4. Getting regular preventive care can help to keep you healthy and well. Preventive care includes getting regular testing and making lifestyle changes as recommended by your health care provider. Talk with your health care provider about:  Which screenings and tests you should have. A screening is a test that checks for a disease when you have no symptoms.  A diet and exercise plan that is right for you. What should I know about screenings and tests to prevent falls? Screening and testing are the best ways to find a health problem early. Early diagnosis and treatment give you the best chance of managing medical conditions that are common after age 50. Certain conditions and lifestyle choices may make you more likely to have a fall. Your health care provider may recommend:  Regular vision checks. Poor vision and conditions such as cataracts can make you more likely to have a fall. If you wear glasses, make sure to get your prescription updated if your vision changes.  Medicine review. Work with your health care provider to regularly review all of the medicines you are taking, including over-the-counter medicines. Ask your health care provider about any side effects that may make you more likely to have a fall. Tell your health care provider if any medicines that you take make you feel dizzy or sleepy.  Osteoporosis screening. Osteoporosis is a condition that causes the bones to get weaker. This can make the bones weak and cause them to break more easily.  Blood pressure screening. Blood pressure changes and medicines  to control blood pressure can make you feel dizzy.  Strength and balance checks. Your health care provider may recommend certain tests to check your strength and balance while standing, walking, or changing positions.  Foot health exam. Foot pain and numbness, as well as not wearing proper footwear, can make you more likely to have a fall.  Depression screening. You may be more likely to have a fall if you have a fear of falling, feel emotionally low, or feel unable to do activities that you used to do.  Alcohol use screening. Using too much alcohol can affect your balance and may make you more likely to have a fall. What actions can I take to lower my risk of falls? General instructions  Talk with your health care provider about your risks for falling. Tell your health care provider if: ? You fall. Be sure to tell your health care provider about all falls, even ones that seem minor. ? You feel dizzy, sleepy, or off-balance.  Take over-the-counter and prescription medicines only as told by your health care provider. These include any supplements.  Eat a healthy diet and maintain a healthy weight. A healthy diet includes low-fat dairy products, low-fat (lean) meats, and fiber from whole grains, beans, and lots of fruits and vegetables. Home safety  Remove any tripping hazards, such as rugs, cords, and clutter.  Install safety equipment such as grab bars in bathrooms and safety rails on stairs.  Keep rooms and walkways well-lit. Activity   Follow a regular exercise  program to stay fit. This will help you maintain your balance. Ask your health care provider what types of exercise are appropriate for you.  If you need a cane or walker, use it as recommended by your health care provider.  Wear supportive shoes that have nonskid soles. Lifestyle  Do not drink alcohol if your health care provider tells you not to drink.  If you drink alcohol, limit how much you have: ? 0-1 drink a day  for women. ? 0-2 drinks a day for men.  Be aware of how much alcohol is in your drink. In the [place] one drink equals one typical bottle of beer (12 oz), one-half glass of wine (5 oz), or one shot of hard liquor (1 oz).  Do not use any products that contain nicotine or tobacco, such as cigarettes and e-cigarettes. If you need help quitting, ask your health care provider. Summary  Having a healthy lifestyle and getting preventive care can help to protect your health and wellness after age 34.  Screening and testing are the best way to find a health problem early and help you avoid having a fall. Early diagnosis and treatment give you the best chance for managing medical conditions that are more common for people who are older than age 41.  Falls are a major cause of broken bones and head injuries in people who are older than age 1. Take precautions to prevent a fall at home.  Work with your health care provider to learn what changes you can make to improve your health and wellness and to prevent falls. This information is not intended to replace advice given to you by your health care provider. Make sure you discuss any questions you have with your health care provider. Document Released: 03/23/2017 Document Revised: 08/31/2018 Document Reviewed: 03/23/2017 Elsevier Patient Education  2020 [employer].

## 2018-11-28 DIAGNOSIS — H5201 Hypermetropia, right eye: Secondary | ICD-10-CM | POA: Diagnosis not present

## 2018-11-28 DIAGNOSIS — H10413 Chronic giant papillary conjunctivitis, bilateral: Secondary | ICD-10-CM | POA: Diagnosis not present

## 2018-11-28 LAB — CMP14+EGFR
ALT: 27 IU/L (ref 0–32)
AST: 20 IU/L (ref 0–40)
Albumin/Globulin Ratio: 2 (ref 1.2–2.2)
Albumin: 4.9 g/dL — ABNORMAL HIGH (ref 3.8–4.8)
Alkaline Phosphatase: 76 IU/L (ref 39–117)
BUN/Creatinine Ratio: 13 (ref 12–28)
BUN: 11 mg/dL (ref 8–27)
Bilirubin Total: 0.4 mg/dL (ref 0.0–1.2)
CO2: 21 mmol/L (ref 20–29)
Calcium: 10.1 mg/dL (ref 8.7–10.3)
Chloride: 106 mmol/L (ref 96–106)
Creatinine, Ser: 0.83 mg/dL (ref 0.57–1.00)
GFR calc Af Amer: 86 mL/min/{1.73_m2} (ref >59)
GFR calc non Af Amer: 74 mL/min/{1.73_m2} (ref >59)
Globulin, Total: 2.4 g/dL (ref 1.5–4.5)
Glucose: 92 mg/dL (ref 65–99)
Potassium: 5 mmol/L (ref 3.5–5.2)
Sodium: 144 mmol/L (ref 134–144)
Total Protein: 7.3 g/dL (ref 6.0–8.5)

## 2018-11-28 LAB — LIPID PANEL
Chol/HDL Ratio: 2.6 ratio (ref 0.0–4.4)
Cholesterol, Total: 162 mg/dL (ref 100–199)
HDL: 63 mg/dL (ref >39)
LDL Calculated: 81 mg/dL (ref 0–99)
Triglycerides: 89 mg/dL (ref 0–149)
VLDL Cholesterol Cal: 18 mg/dL (ref 5–40)

## 2018-11-28 LAB — VITAMIN D 25 HYDROXY (VIT D DEFICIENCY, FRACTURES): Vit D, 25-Hydroxy: 65.2 ng/mL (ref 30.0–100.0)

## 2018-12-07 ENCOUNTER — Ambulatory Visit: Payer: Medicare HMO | Admitting: Family Medicine

## 2018-12-08 ENCOUNTER — Encounter: Payer: Self-pay | Admitting: Family Medicine

## 2019-01-22 ENCOUNTER — Ambulatory Visit (INDEPENDENT_AMBULATORY_CARE_PROVIDER_SITE_OTHER): Payer: Medicare HMO | Admitting: *Deleted

## 2019-01-22 ENCOUNTER — Other Ambulatory Visit: Payer: Self-pay

## 2019-01-22 DIAGNOSIS — Z23 Encounter for immunization: Secondary | ICD-10-CM | POA: Diagnosis not present

## 2019-01-22 NOTE — Progress Notes (Signed)
Pt tolerated vaccine well. Pebbles Zeiders, CMA  

## 2019-01-24 DIAGNOSIS — L820 Inflamed seborrheic keratosis: Secondary | ICD-10-CM | POA: Diagnosis not present

## 2019-02-12 ENCOUNTER — Other Ambulatory Visit: Payer: Self-pay

## 2019-02-12 ENCOUNTER — Telehealth (INDEPENDENT_AMBULATORY_CARE_PROVIDER_SITE_OTHER): Payer: Medicare HMO | Admitting: Family Medicine

## 2019-02-12 DIAGNOSIS — R059 Cough, unspecified: Secondary | ICD-10-CM

## 2019-02-12 DIAGNOSIS — R05 Cough: Secondary | ICD-10-CM | POA: Diagnosis not present

## 2019-02-12 DIAGNOSIS — J3489 Other specified disorders of nose and nasal sinuses: Secondary | ICD-10-CM

## 2019-02-12 DIAGNOSIS — R053 Chronic cough: Secondary | ICD-10-CM | POA: Insufficient documentation

## 2019-02-12 MED ORDER — PANTOPRAZOLE SODIUM 20 MG PO TBEC: 20.0000 mg | DELAYED_RELEASE_TABLET | Freq: Every day | ORAL | 1 refills | Status: DC

## 2019-02-12 NOTE — Progress Notes (Signed)
[facility] [facility] Telemedicine Visit  Patient consented to have virtual visit. Method of visit: Telephone  Encounter participants: Patient: [name] - located at home Provider: Chrisandra Netters - located at office Others (if applicable): n/a  Chief Complaint: chronic cough/mucous  HPI:  Chronic cough - has long history of chronic cough. For a long while was thought to be due to sinus drainage, but at our visit in July we thought perhaps more likely to be acid reflux related. Has gradually gotten worse over the last 6 months, despite adding pepcid at last visit. She thinks the pepcid helped some at first, but now doesn't notice a difference. No fevers. Feels like she has phlegm at the back of her throat. Noticing more acid reflux/heartburn feeling now too. Coughs up green phlegm. Never smoker. No blood in sputum. Is trying to adjust her diet to cut back on soft drinks, chocolate etc. Drinks 2 cups of coffee per day. No weight loss or night sweats. Eating well. Waking up at night with mucous in her throat. Does not feel nasal congestion.   ROS: per HPI  Pertinent PMHx: history of chronic cough, hyperlipidemia, allergic rhinitis, osteopenia  Exam:  Respiratory: Patient speaking normally in full sentences throughout the encounter, without any respiratory distress evident. No cough during encounter.  Assessment/Plan:  Chronic cough Believe cough related to acid reflux. Initially improved with pepcid, now bothersome again. Add protonix 20mg  daily. If not improved in the next couple of weeks patient will call back to discuss again. No red flags (weight loss, fevers, night sweats).    Time spent during visit with patient: 6 minutes

## 2019-02-12 NOTE — Assessment & Plan Note (Signed)
Believe cough related to acid reflux. Initially improved with pepcid, now bothersome again. Add protonix 20mg  daily. If not improved in the next couple of weeks patient will call back to discuss again. No red flags (weight loss, fevers, night sweats).

## 2019-02-27 ENCOUNTER — Other Ambulatory Visit: Payer: Self-pay

## 2019-02-27 ENCOUNTER — Telehealth: Payer: Self-pay | Admitting: *Deleted

## 2019-02-27 ENCOUNTER — Ambulatory Visit (INDEPENDENT_AMBULATORY_CARE_PROVIDER_SITE_OTHER): Payer: Medicare HMO | Admitting: Family Medicine

## 2019-02-27 ENCOUNTER — Ambulatory Visit: Payer: Medicare HMO | Admitting: Family Medicine

## 2019-02-27 VITALS — BP 140/80 | HR 86 | Wt 145.0 lb

## 2019-02-27 DIAGNOSIS — R05 Cough: Secondary | ICD-10-CM

## 2019-02-27 DIAGNOSIS — R053 Chronic cough: Secondary | ICD-10-CM

## 2019-02-27 MED ORDER — PANTOPRAZOLE SODIUM 20 MG PO TBEC: 20.0000 mg | DELAYED_RELEASE_TABLET | Freq: Two times a day (BID) | ORAL | 1 refills | Status: DC

## 2019-02-27 NOTE — Telephone Encounter (Signed)
Received fax from pharmacy that pantoprazole 20mg  is covered at a max dose of one pill per day without a precert.  Would a change to a 40mg  tablet be appropriate or would you like to pursue a PA? Christen Bame, CMA

## 2019-02-27 NOTE — Assessment & Plan Note (Addendum)
Cough likely related to acid reflux. Started Protonix (20 mg, once daily) on 9/21 without improvement or worsening of symptoms. While suspect GI cause of cough, must consider other etiologies including lung disease. No red flags noted (weight loss, fever, blood in sputum, night sweats).  - Advised patient to take Protonix (20 mg) twice daily -- once in the morning and once at night.  - Obtain CXR.  - Refer to GI to consider EGD - Informed patient to contact clinic if symptoms worsen

## 2019-02-27 NOTE — Progress Notes (Signed)
    Subjective:  [name] is a 68 y.o. female who presents to the [facility] today with a chief complaint of chronic cough.   HPI:  Chronic Cough:  Patient has history of chronic cough for over 1 year. Thought to be due to sinus drainage initially but later attributed to acid reflux following clinic visit in July. Cough worsened over the next 6 weeks despite initial improvement with Pepcid. She was then prescribed Protonix (20 mg, daily) during recent clinic visit on 02/12/19. States that cough has not worsened or improved since starting Protonix. Coughs up green phlegm. No blood in sputum. She reports 5 episodes of reflux and vomiting at night during the first week while on Protonix. Reports that vomit consisted of phlegm and food but was non-bloody. Patient has not had an episode of vomiting for the past week. Her heartburn, which typically occurs at night, has also improved over the past week. She reports less heartburn during the day and believes this may be related to her taking Protonix in the morning. She has slept with three pillows and stopped eating after 7 pm for the past 2-3 weeks and believes the decline in vomiting is also related to these interventions. Has reduced chocolate, alcohol, and greasy food intake without improvement. Drinks 2 cups of coffee daily. She has experienced mild chest tightness over the past 2-3 weeks. Denies dysphagia, fever, weight loss, night sweats, shortness of breath, abdominal pain.    Chief Complaint noted Review of Symptoms - see HPI PMH - Never smoker. Secondhand exposure from family members.      Objective:  Physical Exam: BP 140/80   Pulse 86   Wt 145 lb (65.8 kg)   SpO2 98%   BMI 25.69 kg/m    Gen: NAD, resting comfortably CV: RRR with no murmurs appreciated Pulm: CTAB with no crackles, wheezes, or rhonchi GI: Soft, Nontender, Nondistended.   Assessment/Plan:  Chronic cough Cough likely related to acid reflux. Started Protonix (20 mg,  once daily) on 9/21 without improvement or worsening of symptoms. While suspect GI cause of cough, must consider other etiologies including lung disease. No red flags noted (weight loss, fever, blood in sputum, night sweats).  - Advised patient to take Protonix (20 mg) twice daily -- once in the morning and once at night.  - Obtain CXR.  - Refer to GI to consider EGD - Informed patient to contact clinic if symptoms worsen    Patient seen along with [employer] student Gwynneth Albright. I personally evaluated this patient along with the student, and verified all aspects of the history, physical exam, and medical decision making as documented by the student. I agree with the student's documentation and have made all necessary edits.  Chrisandra Netters, MD [place]

## 2019-02-27 NOTE — Patient Instructions (Signed)
It was great to see you again today!  For this cough, go get a chest xray Referring to GI doctor for evaluation Go ahead and increase the protonix to 20mg  twice daily - sent in a new prescription  Follow up with me after you see the GI doctor, sooner if needed or if this worsens  Be well, Dr. Ardelia Mems    Gastroesophageal Reflux Disease, Adult Gastroesophageal reflux (GER) happens when acid from the stomach flows up into the tube that connects the mouth and the stomach (esophagus). Normally, food travels down the esophagus and stays in the stomach to be digested. With GER, food and stomach acid sometimes move back up into the esophagus. You may have a disease called gastroesophageal reflux disease (GERD) if the reflux:  Happens often.  Causes frequent or very bad symptoms.  Causes problems such as damage to the esophagus. When this happens, the esophagus becomes sore and swollen (inflamed). Over time, GERD can make small holes (ulcers) in the lining of the esophagus. What are the causes? This condition is caused by a problem with the muscle between the esophagus and the stomach. When this muscle is weak or not normal, it does not close properly to keep food and acid from coming back up from the stomach. The muscle can be weak because of:  Tobacco use.  Pregnancy.  Having a certain type of hernia (hiatal hernia).  Alcohol use.  Certain foods and drinks, such as coffee, chocolate, onions, and peppermint. What increases the risk? You are more likely to develop this condition if you:  Are overweight.  Have a disease that affects your connective tissue.  Use NSAID medicines. What are the signs or symptoms? Symptoms of this condition include:  Heartburn.  Difficult or painful swallowing.  The feeling of having a lump in the throat.  A bitter taste in the mouth.  Bad breath.  Having a lot of saliva.  Having an upset or bloated stomach.  Belching.  Chest pain.  Different conditions can cause chest pain. Make sure you see your doctor if you have chest pain.  Shortness of breath or noisy breathing (wheezing).  Ongoing (chronic) cough or a cough at night.  Wearing away of the surface of teeth (tooth enamel).  Weight loss. How is this treated? Treatment will depend on how bad your symptoms are. Your doctor may suggest:  Changes to your diet.  Medicine.  Surgery. Follow these instructions at home: Eating and drinking   Follow a diet as told by your doctor. You may need to avoid foods and drinks such as: ? Coffee and tea (with or without caffeine). ? Drinks that contain alcohol. ? Energy drinks and sports drinks. ? Bubbly (carbonated) drinks or sodas. ? Chocolate and cocoa. ? Peppermint and mint flavorings. ? Garlic and onions. ? Horseradish. ? Spicy and acidic foods. These include peppers, chili powder, curry powder, vinegar, hot sauces, and BBQ sauce. ? Citrus fruit juices and citrus fruits, such as oranges, lemons, and limes. ? Tomato-based foods. These include red sauce, chili, salsa, and pizza with red sauce. ? Fried and fatty foods. These include donuts, french fries, potato chips, and high-fat dressings. ? High-fat meats. These include hot dogs, rib eye steak, sausage, ham, and bacon. ? High-fat dairy items, such as whole milk, butter, and cream cheese.  Eat small meals often. Avoid eating large meals.  Avoid drinking large amounts of liquid with your meals.  Avoid eating meals during the 2-3 hours before bedtime.  Avoid  lying down right after you eat.  Do not exercise right after you eat. Lifestyle   Do not use any products that contain nicotine or tobacco. These include cigarettes, e-cigarettes, and chewing tobacco. If you need help quitting, ask your doctor.  Try to lower your stress. If you need help doing this, ask your doctor.  If you are overweight, lose an amount of weight that is healthy for you. Ask your  doctor about a safe weight loss goal. General instructions  Pay attention to any changes in your symptoms.  Take over-the-counter and prescription medicines only as told by your doctor. Do not take aspirin, ibuprofen, or other NSAIDs unless your doctor says it is okay.  Wear loose clothes. Do not wear anything tight around your waist.  Raise (elevate) the head of your bed about 6 inches (15 cm).  Avoid bending over if this makes your symptoms worse.  Keep all follow-up visits as told by your doctor. This is important. Contact a doctor if:  You have new symptoms.  You lose weight and you do not know why.  You have trouble swallowing or it hurts to swallow.  You have wheezing or a cough that keeps happening.  Your symptoms do not get better with treatment.  You have a hoarse voice. Get help right away if:  You have pain in your arms, neck, jaw, teeth, or back.  You feel sweaty, dizzy, or light-headed.  You have chest pain or shortness of breath.  You throw up (vomit) and your throw-up looks like blood or coffee grounds.  You pass out (faint).  Your poop (stool) is bloody or black.  You cannot swallow, drink, or eat. Summary  If a person has gastroesophageal reflux disease (GERD), food and stomach acid move back up into the esophagus and cause symptoms or problems such as damage to the esophagus.  Treatment will depend on how bad your symptoms are.  Follow a diet as told by your doctor.  Take all medicines only as told by your doctor. This information is not intended to replace advice given to you by your health care provider. Make sure you discuss any questions you have with your health care provider. Document Released: 10/27/2007 Document Revised: 11/16/2017 Document Reviewed: 11/16/2017 Elsevier Patient Education  2020 [employer].

## 2019-02-28 ENCOUNTER — Encounter: Payer: Self-pay | Admitting: Gastroenterology

## 2019-02-28 ENCOUNTER — Ambulatory Visit
Admission: RE | Admit: 2019-02-28 | Discharge: 2019-02-28 | Disposition: A | Discharge status: Home / Self Care | Payer: Medicare HMO | Source: Ambulatory Visit | Attending: Family Medicine | Admitting: Family Medicine

## 2019-02-28 DIAGNOSIS — R05 Cough: Secondary | ICD-10-CM | POA: Diagnosis not present

## 2019-02-28 DIAGNOSIS — R053 Chronic cough: Secondary | ICD-10-CM

## 2019-02-28 MED ORDER — PANTOPRAZOLE SODIUM 40 MG PO TBEC: 40.0000 mg | DELAYED_RELEASE_TABLET | Freq: Every day | ORAL | 2 refills | Status: DC

## 2019-02-28 NOTE — Telephone Encounter (Signed)
LMOVM informing pt. [name], CMA  

## 2019-02-28 NOTE — Telephone Encounter (Signed)
Pt called to check status.  She did double up yesterday (20mg  BID) but did not notice much difference.  She will be out of the protonix in 5 days. Christen Bame, CMA

## 2019-02-28 NOTE — Telephone Encounter (Signed)
We can switch to 40mg  once per day. I sent this in for her. Please inform patient, thanks. Leeanne Rio, MD

## 2019-02-28 NOTE — Telephone Encounter (Signed)
Pt informed.  Osvaldo Lamping, CMA  

## 2019-03-02 ENCOUNTER — Encounter: Payer: Self-pay | Admitting: Family Medicine

## 2019-03-06 ENCOUNTER — Other Ambulatory Visit: Payer: Self-pay | Admitting: Family Medicine

## 2019-03-19 DIAGNOSIS — L819 Disorder of pigmentation, unspecified: Secondary | ICD-10-CM | POA: Diagnosis not present

## 2019-03-19 DIAGNOSIS — L820 Inflamed seborrheic keratosis: Secondary | ICD-10-CM | POA: Diagnosis not present

## 2019-03-20 ENCOUNTER — Other Ambulatory Visit: Payer: Self-pay | Admitting: Family Medicine

## 2019-03-21 ENCOUNTER — Other Ambulatory Visit: Payer: Self-pay | Admitting: Family Medicine

## 2019-03-23 ENCOUNTER — Other Ambulatory Visit: Payer: Self-pay | Admitting: Family Medicine

## 2019-03-26 DIAGNOSIS — L570 Actinic keratosis: Secondary | ICD-10-CM | POA: Diagnosis not present

## 2019-03-28 ENCOUNTER — Encounter: Payer: Self-pay | Admitting: Gastroenterology

## 2019-03-28 ENCOUNTER — Ambulatory Visit: Payer: Medicare HMO | Admitting: Gastroenterology

## 2019-03-28 VITALS — BP 140/80 | HR 69 | Temp 98.7°F | Ht 63.0 in | Wt 141.0 lb

## 2019-03-28 DIAGNOSIS — R053 Chronic cough: Secondary | ICD-10-CM

## 2019-03-28 DIAGNOSIS — K219 Gastro-esophageal reflux disease without esophagitis: Secondary | ICD-10-CM

## 2019-03-28 DIAGNOSIS — Z1159 Encounter for screening for other viral diseases: Secondary | ICD-10-CM | POA: Diagnosis not present

## 2019-03-28 DIAGNOSIS — R05 Cough: Secondary | ICD-10-CM | POA: Diagnosis not present

## 2019-03-28 NOTE — Progress Notes (Signed)
HPI: This is a very pleasant 68 year old woman   who was referred to me by Leeanne Rio, MD  to evaluate GERD, cough.    Chief complaint is cough, GERD, dysphagia  She is a long-term patient of Dr. Mar Daring man who is a another gastroenterologist here in town.  She has undergone at least 2 colonoscopies with Dr. Collene Mares, the last one was less than a year ago, December 2019 for polyp surveillance.  A single subcentimeter polyp was found, it was a sessile serrated polyp.  She is not sure why she was referred here instead of her previous gastroenterologist  For least a couple years she has had issues with chronic cough.  Previously she attributed this to sinus drainage which she really was having.  Cough is worsened she will cough constantly throughout the day at times.  She will wake at night with coughing and sometimes vomit even.  Sometimes she has heartburn at night waterbrash type symptoms.  She has pyrosis and heartburn intermittently throughout the day.  She met a new primary care physician and they tried her on Pepcid twice daily that helped but just barely with her coughing.  She was then switched to proton pump inhibitor which she takes about half an hour to an hour before breakfast meal every day.  She has noticed significant improvement in her cough since starting the proton pump inhibitor.  She is actually been able to sleep with one pillow instead of 3.  She has a globus sensation rather constantly.  She has intermittent dysphagia to meats, peanut butter sandwich, never liquids.  She has gained 20 pounds in the past 5 years   Review of systems: Pertinent positive and negative review of systems were noted in the above HPI section. All other review negative.   Past Medical History:  Diagnosis Date  . Colon polyp   . Gallstones   . GERD (gastroesophageal reflux disease)   . Hyperlipidemia   . Postmenopausal hormone replacement therapy     Past Surgical History:  Procedure  Laterality Date  . CHOLECYSTECTOMY N/A 07/05/2017   Procedure: LAPAROSCOPIC CHOLECYSTECTOMY;  Surgeon: Coralie Keens, MD;  Location: [facility];  Service: General;  Laterality: N/A;  . SHOULDER ARTHROSCOPY Left 1990s   "frozen shoulder"  . TIBIA FRACTURE SURGERY  2009   rod placed   . TONSILLECTOMY    . TOTAL ABDOMINAL HYSTERECTOMY N/A ~ 2000  . TUBAL LIGATION N/A ~ 1995    Current Outpatient Medications  Medication Sig Dispense Refill  . calcium carbonate (CALCIUM 600) 600 MG TABS tablet Take 1 tablet (600 mg total) by mouth 2 (two) times daily with a meal. 60 tablet 1  . Cholecalciferol (VITAMIN D3) 2000 units TABS Take 1 tablet by mouth daily. 30 tablet 2  . famotidine (PEPCID) 20 MG tablet TAKE 1 TABLET BY MOUTH TWICE A DAY 180 tablet 1  . pantoprazole (PROTONIX) 40 MG tablet TAKE 1 TABLET BY MOUTH EVERY DAY 90 tablet 1  . rosuvastatin (CRESTOR) 10 MG tablet Take 1 tablet (10 mg total) by mouth daily at 6 PM. 90 tablet 1   No current facility-administered medications for this visit.     Allergies as of 03/28/2019  . (No Known Allergies)    Family History  Problem Relation Age of Onset  . Thyroid disease Mother   . Osteoporosis Mother   . Prostate cancer Father   . Colon cancer Neg Hx   . Esophageal cancer Neg Hx   . Rectal  cancer Neg Hx     Social History   Socioeconomic History  . Marital status: Married    Spouse name: Not on file  . Number of children: 1  . Years of education: Not on file  . Highest education level: Not on file  Occupational History  . Occupation: retired  Scientific laboratory technician  . Financial resource strain: Not on file  . Food insecurity    Worry: Not on file    Inability: Not on file  . Transportation needs    Medical: Not on file    Non-medical: Not on file  Tobacco Use  . Smoking status: Never Smoker  . Smokeless tobacco: Never Used  Substance and Sexual Activity  . Alcohol use: No  . Drug use: No  . Sexual activity: Yes  Lifestyle  .  Physical activity    Days per week: Not on file    Minutes per session: Not on file  . Stress: Not on file  Relationships  . Social Herbalist on phone: Not on file    Gets together: Not on file    Attends religious service: Not on file    Active member of club or organization: Not on file    Attends meetings of clubs or organizations: Not on file    Relationship status: Not on file  . Intimate partner violence    Fear of current or ex partner: Not on file    Emotionally abused: Not on file    Physically abused: Not on file    Forced sexual activity: Not on file  Other Topics Concern  . Not on file  Social History Narrative  . Not on file     Physical Exam: BP 140/80   Pulse 69   Temp 98.7 F (37.1 C)   Ht 5' 3"  (1.6 m)   Wt 141 lb (64 kg)   BMI 24.98 kg/m  Constitutional: generally well-appearing Psychiatric: alert and oriented x3 Eyes: extraocular movements intact Mouth: oral pharynx moist, no lesions Neck: supple no lymphadenopathy Cardiovascular: heart regular rate and rhythm Lungs: clear to auscultation bilaterally Abdomen: soft, nontender, nondistended, no obvious ascites, no peritoneal signs, normal bowel sounds Extremities: no lower extremity edema bilaterally Skin: no lesions on visible extremities   Assessment and plan: 68 y.o. female with chronic cough, GERD  I think there is a good chance that her chronic cough is at least partially related to GERD.  She has noticed significant improvement already after about 1 month of proton pump inhibitor taken every morning.  She has not taking the medicine at the correct time in relation to meals and so she is going to change it so she is taking it 20 to 30 minutes before breakfast.  She is still taking H2 blocker Pepcid twice daily once with her Protonix in the morning and once at 6 PM.  I recommended she stop the morning dose and instead take her Pepcid at bedtime every night.  Given her dysphagia I  recommended EGD at her soonest convenience.  I suspect it is probably GERD related, she may have developed a stricture.  Obviously need to rule out neoplasia which I think is unlikely.    Please see the "Patient Instructions" section for addition details about the plan.   Owens Loffler, MD [facility] 03/28/2019, 3:05 PM  Cc: Leeanne Rio, MD

## 2019-03-28 NOTE — Patient Instructions (Signed)
Make sure you take your Protonix 30 minutes before breakfast and your Pepcid at night.  You have been scheduled for an endoscopy and colonoscopy. Please follow the written instructions given to you at your visit today. Please pick up your prep supplies at the pharmacy within the next 1-3 days. If you use inhalers (even only as needed), please bring them with you on the day of your procedure.

## 2019-04-11 ENCOUNTER — Encounter: Payer: Self-pay | Admitting: Gastroenterology

## 2019-04-13 DIAGNOSIS — Z1231 Encounter for screening mammogram for malignant neoplasm of breast: Secondary | ICD-10-CM | POA: Diagnosis not present

## 2019-04-18 ENCOUNTER — Ambulatory Visit (INDEPENDENT_AMBULATORY_CARE_PROVIDER_SITE_OTHER): Payer: Medicare HMO

## 2019-04-18 ENCOUNTER — Other Ambulatory Visit: Payer: Self-pay | Admitting: Gastroenterology

## 2019-04-18 DIAGNOSIS — Z1159 Encounter for screening for other viral diseases: Secondary | ICD-10-CM | POA: Diagnosis not present

## 2019-04-20 LAB — SARS CORONAVIRUS 2 (TAT 6-24 HRS): SARS Coronavirus 2: NEGATIVE

## 2019-04-23 ENCOUNTER — Other Ambulatory Visit: Payer: Self-pay

## 2019-04-23 ENCOUNTER — Encounter: Payer: Self-pay | Admitting: Gastroenterology

## 2019-04-23 ENCOUNTER — Ambulatory Visit (AMBULATORY_SURGERY_CENTER): Payer: Medicare HMO | Admitting: Gastroenterology

## 2019-04-23 VITALS — BP 144/79 | HR 76 | Temp 98.1°F | Resp 19 | Ht 63.0 in | Wt 141.0 lb

## 2019-04-23 DIAGNOSIS — K219 Gastro-esophageal reflux disease without esophagitis: Secondary | ICD-10-CM

## 2019-04-23 DIAGNOSIS — K2950 Unspecified chronic gastritis without bleeding: Secondary | ICD-10-CM | POA: Diagnosis not present

## 2019-04-23 DIAGNOSIS — K449 Diaphragmatic hernia without obstruction or gangrene: Secondary | ICD-10-CM | POA: Diagnosis not present

## 2019-04-23 DIAGNOSIS — K2970 Gastritis, unspecified, without bleeding: Secondary | ICD-10-CM

## 2019-04-23 DIAGNOSIS — R1310 Dysphagia, unspecified: Secondary | ICD-10-CM | POA: Diagnosis not present

## 2019-04-23 MED ORDER — SODIUM CHLORIDE 0.9 % IV SOLN: 500.0000 mL | Freq: Once | INTRAVENOUS | Status: DC

## 2019-04-23 NOTE — Progress Notes (Signed)
Called to room to assist during endoscopic procedure.  Patient ID and intended procedure confirmed with present staff. Received instructions for my participation in the procedure from the performing physician.  

## 2019-04-23 NOTE — Progress Notes (Signed)
To PACU, VSS. Report to [email or web address] 

## 2019-04-23 NOTE — Progress Notes (Signed)
Pt's states no medical or surgical changes since previsit or office visit. 

## 2019-04-23 NOTE — Progress Notes (Signed)
[facility] checked patient in.

## 2019-04-23 NOTE — Patient Instructions (Signed)
Continue your protonix 1/2 hour before breakfast every day.  Increase your pepcid to 40 mg every night on an empty stomach.  Thank-you for choosing [place] for your healthcare needs today.  YOU HAD AN ENDOSCOPIC PROCEDURE TODAY AT [facility]:   Refer to the procedure report that was given to you for any specific questions about what was found during the examination.  If the procedure report does not answer your questions, please call your gastroenterologist to clarify.  If you requested that your care partner not be given the details of your procedure findings, then the procedure report has been included in a sealed envelope for you to review at your convenience later.  YOU SHOULD EXPECT: Some feelings of bloating in the abdomen. Passage of more gas than usual.  Walking can help get rid of the air that was put into your GI tract during the procedure and reduce the bloating.   Please Note:  You might notice some irritation and congestion in your nose or some drainage.  This is from the oxygen used during your procedure.  There is no need for concern and it should clear up in a day or so.  SYMPTOMS TO REPORT IMMEDIATELY:    Following upper endoscopy (EGD)  Vomiting of blood or coffee ground material  New chest pain or pain under the shoulder blades  Painful or persistently difficult swallowing  New shortness of breath  Fever of 100F or higher  Black, tarry-looking stools  For urgent or emergent issues, a gastroenterologist can be reached at any hour by calling [phone].   DIET:  We do recommend a small meal at first maybe something soft, but then you may proceed to your regular diet.  Drink plenty of fluids but you should avoid alcoholic beverages for 24 hours.  ACTIVITY:  You should plan to take it easy for the rest of today and you should NOT DRIVE or use heavy machinery until tomorrow (because of the sedation medicines used during the test).    FOLLOW UP: Our staff  will call the number listed on your records 48-72 hours following your procedure to check on you and address any questions or concerns that you may have regarding the information given to you following your procedure. If we do not reach you, we will leave a message.  We will attempt to reach you two times.  During this call, we will ask if you have developed any symptoms of COVID 19. If you develop any symptoms (ie: fever, flu-like symptoms, shortness of breath, cough etc.) before then, please call [phone].  If you test positive for Covid 19 in the 2 weeks post procedure, please call and report this information to [place].    If any biopsies were taken you will be contacted by phone or by letter within the next 1-3 weeks.  Please call us at [phone] if you have not heard about the biopsies in 3 weeks.    SIGNATURES/CONFIDENTIALITY: You and/or your care partner have signed paperwork which will be entered into your electronic medical record.  These signatures attest to the fact that that the information above on your After Visit Summary has been reviewed and is understood.  Full responsibility of the confidentiality of this discharge information lies with you and/or your care-partner.

## 2019-04-23 NOTE — Op Note (Signed)
[place] Patient Name: [name] Procedure Date: 04/23/2019 1:10 PM MRN: [id number] Endoscopist: Milus Banister , MD Age: 68 Referring MD:  Date of Birth: 1950-06-27 Gender: Female Account #: [id number] Procedure:                Upper GI endoscopy Indications:              Dysphagia, Heartburn Medicines:                Monitored Anesthesia Care Procedure:                Pre-Anesthesia Assessment:                           - Prior to the procedure, a History and Physical                            was performed, and patient medications and                            allergies were reviewed. The patient's tolerance of                            previous anesthesia was also reviewed. The risks                            and benefits of the procedure and the sedation                            options and risks were discussed with the patient.                            All questions were answered, and informed consent                            was obtained. Prior Anticoagulants: The patient has                            taken no previous anticoagulant or antiplatelet                            agents. ASA Grade Assessment: II - A patient with                            mild systemic disease. After reviewing the risks                            and benefits, the patient was deemed in                            satisfactory condition to undergo the procedure.                           After obtaining informed consent, the endoscope was  passed under direct vision. Throughout the                            procedure, the patient's blood pressure, pulse, and                            oxygen saturations were monitored continuously. The                            Endoscope was introduced through the mouth, and                            advanced to the second part of duodenum. The upper                            GI endoscopy was accomplished  without difficulty.                            The patient tolerated the procedure well. Scope In: Scope Out: Findings:                 Mild inflammation characterized by erythema,                            friability and granularity was found in the gastric                            body and in the gastric antrum. Biopsies were taken                            with a cold forceps for histology.                           A small hiatal hernia was present.                           The exam was otherwise without abnormality. Complications:            No immediate complications. Estimated blood loss:                            None. Estimated Blood Loss:     Estimated blood loss: none. Impression:               - Gastritis. Biopsied to check for H. pylori.                           - Small hiatal hernia.                           - The examination was otherwise normal. Recommendation:           - Patient has a contact number available for                            emergencies. The signs and symptoms of potential  delayed complications were discussed with the                            patient. Return to normal activities tomorrow.                            Written discharge instructions were provided to the                            patient.                           - Resume previous diet.                           - Continue present medications. Plesae continue                            your protonix 40mg  pill before breakfast every                            morning. Please increase your bedtime pepcid                            (famotidine) to 40mg  every night.                           - Await pathology results. Milus Banister, MD 04/23/2019 1:39:14 PM This report has been signed electronically.

## 2019-04-25 ENCOUNTER — Telehealth: Payer: Self-pay

## 2019-04-25 NOTE — Telephone Encounter (Signed)
  Follow up Call-  Call back number 04/23/2019  Post procedure Call Back phone  # [phone]  Permission to leave phone message Yes  Some recent data might be hidden     Patient questions:  Do you have a fever, pain , or abdominal swelling? No. Pain Score  0 *  Have you tolerated food without any problems? Yes.    Have you been able to return to your normal activities? Yes.    Do you have any questions about your discharge instructions: Diet   No. Medications  No. Follow up visit  No.  Do you have questions or concerns about your Care? No.  Actions: * If pain score is 4 or above: No action needed, pain <4.  1. Have you developed a fever since your procedure? No  2.   Have you had an respiratory symptoms (SOB or cough) since your procedure? No  3.   Have you tested positive for COVID 19 since your procedure No  4.   Have you had any family members/close contacts diagnosed with the COVID 19 since your procedure?  No   If yes to any of these questions please route to Joylene John, RN and Alphonsa Gin, RN.

## 2019-04-26 DIAGNOSIS — L570 Actinic keratosis: Secondary | ICD-10-CM | POA: Diagnosis not present

## 2019-04-26 DIAGNOSIS — L578 Other skin changes due to chronic exposure to nonionizing radiation: Secondary | ICD-10-CM | POA: Diagnosis not present

## 2019-04-30 ENCOUNTER — Encounter: Payer: Self-pay | Admitting: Gastroenterology

## 2019-04-30 DIAGNOSIS — R69 Illness, unspecified: Secondary | ICD-10-CM | POA: Diagnosis not present

## 2019-05-01 ENCOUNTER — Other Ambulatory Visit: Payer: Self-pay

## 2019-05-01 ENCOUNTER — Encounter: Payer: Self-pay | Admitting: Family Medicine

## 2019-05-01 ENCOUNTER — Ambulatory Visit (INDEPENDENT_AMBULATORY_CARE_PROVIDER_SITE_OTHER): Payer: Medicare HMO | Admitting: Family Medicine

## 2019-05-01 DIAGNOSIS — K219 Gastro-esophageal reflux disease without esophagitis: Secondary | ICD-10-CM

## 2019-05-01 DIAGNOSIS — J301 Allergic rhinitis due to pollen: Secondary | ICD-10-CM | POA: Diagnosis not present

## 2019-05-01 DIAGNOSIS — Z23 Encounter for immunization: Secondary | ICD-10-CM | POA: Diagnosis not present

## 2019-05-01 MED ORDER — FAMOTIDINE 40 MG PO TABS: 40.0000 mg | ORAL_TABLET | Freq: Every day | ORAL | 1 refills | Status: DC

## 2019-05-01 NOTE — Progress Notes (Signed)
Date of Visit: 05/01/2019   HPI:  Patient presents for follow up.  Pneumonia vaccine - due for PSV23 vaccine today  GERD - had EGD,was put on pepcid 40mg  at night, continues on protonix 40mg  daily. Symptoms are much better controlled on this. Patient reports feeling 95% better. EGD unremarkable except for gastritis, biopsies were neg for H pylori.  Sinuses - taking flonase and zyrtec regularly. When she stops these notices increase in nasal congestion, improved upon resuming.  ROS: See HPI.  [place]: history of allergic rhinitis, GERD, hyperlipidemia  PHYSICAL EXAM: BP 132/82   Pulse 75   Wt 146 lb (66.2 kg)   SpO2 98%   BMI 25.86 kg/m  Gen: no acute distress, pleasant, cooperative HEENT: normocephalic, atraumatic  Lungs: normal work of breathing  Neuro: alert, grossly nonfocal, speech normal  ASSESSMENT/PLAN:  Health maintenance:  -PSV23 vaccine given today  Allergic rhinitis Continue flonase and zyrtec, advised ok to do these daily  GERD (gastroesophageal reflux disease) Symptoms greatly improved. Continue current regimen.  FOLLOW UP: Follow up next year for regular annual physical  [place] J. Ardelia Mems, [place]

## 2019-05-01 NOTE — Patient Instructions (Addendum)
It was great to see you again today!  Stay on current medications Sent in more pepcid for you  Pneumonia shot today  Be well, Dr. Ardelia Mems

## 2019-05-07 DIAGNOSIS — K219 Gastro-esophageal reflux disease without esophagitis: Secondary | ICD-10-CM | POA: Insufficient documentation

## 2019-05-07 NOTE — Assessment & Plan Note (Signed)
Symptoms greatly improved. Continue current regimen.

## 2019-05-07 NOTE — Assessment & Plan Note (Signed)
Continue flonase and zyrtec, advised ok to do these daily

## 2019-06-22 ENCOUNTER — Ambulatory Visit: Payer: Medicare HMO

## 2019-06-29 ENCOUNTER — Ambulatory Visit: Payer: Medicare HMO | Attending: Internal Medicine

## 2019-06-29 DIAGNOSIS — Z23 Encounter for immunization: Secondary | ICD-10-CM | POA: Insufficient documentation

## 2019-06-29 NOTE — Progress Notes (Signed)
   Covid-19 Vaccination Clinic  Name:  [name]    MRN: [id number] DOB: Oct 09, 1950  06/29/2019  [name] was observed post Covid-19 immunization for 15 minutes without incidence. She was provided with Vaccine Information Sheet and instruction to access the V-Safe system.   [name] was instructed to call 911 with any severe reactions post vaccine: Marland Kitchen Difficulty breathing  . Swelling of your face and throat  . A fast heartbeat  . A bad rash all over your body  . Dizziness and weakness    Immunizations Administered    Name Date Dose VIS Date Route   Pfizer COVID-19 Vaccine 06/29/2019  1:02 PM 0.3 mL 05/04/2019 Intramuscular   Manufacturer: [place]   Lot: [id number]   [place]: [id number]

## 2019-06-30 ENCOUNTER — Ambulatory Visit: Payer: Medicare HMO

## 2019-07-04 ENCOUNTER — Ambulatory Visit: Payer: Medicare HMO

## 2019-07-06 ENCOUNTER — Ambulatory Visit: Payer: Medicare HMO

## 2019-07-24 ENCOUNTER — Ambulatory Visit: Payer: Medicare HMO | Attending: Internal Medicine

## 2019-07-24 DIAGNOSIS — Z23 Encounter for immunization: Secondary | ICD-10-CM | POA: Insufficient documentation

## 2019-07-24 NOTE — Progress Notes (Signed)
   Covid-19 Vaccination Clinic  Name:  [name]    MRN: [id number] DOB: 07-28-1950  07/24/2019  [name] was observed post Covid-19 immunization for 15 minutes without incident. She was provided with Vaccine Information Sheet and instruction to access the V-Safe system.   [name] was instructed to call 911 with any severe reactions post vaccine: Marland Kitchen Difficulty breathing  . Swelling of face and throat  . A fast heartbeat  . A bad rash all over body  . Dizziness and weakness   Immunizations Administered    Name Date Dose VIS Date Route   Pfizer COVID-19 Vaccine 07/24/2019 12:27 PM 0.3 mL 05/04/2019 Intramuscular   Manufacturer: [place]   Lot: [id number]   [place]: [id number]

## 2019-09-12 ENCOUNTER — Other Ambulatory Visit: Payer: Self-pay | Admitting: Family Medicine

## 2019-12-04 ENCOUNTER — Encounter: Payer: Medicare HMO | Admitting: Family Medicine

## 2019-12-20 ENCOUNTER — Encounter: Payer: Self-pay | Admitting: Family Medicine

## 2019-12-20 ENCOUNTER — Other Ambulatory Visit: Payer: Self-pay

## 2019-12-20 ENCOUNTER — Ambulatory Visit (INDEPENDENT_AMBULATORY_CARE_PROVIDER_SITE_OTHER): Payer: Medicare HMO | Admitting: Family Medicine

## 2019-12-20 VITALS — BP 125/60 | HR 61 | Ht 63.5 in | Wt 136.4 lb

## 2019-12-20 DIAGNOSIS — E785 Hyperlipidemia, unspecified: Secondary | ICD-10-CM

## 2019-12-20 MED ORDER — AZELASTINE HCL 0.15 % NA SOLN: NASAL | 3 refills | Status: DC

## 2019-12-20 NOTE — Patient Instructions (Signed)
It was great to see you again today!  Try switching to claritin Add astelin nasal spray - sent in for you  Checking labs today  Follow up with me in 1 year for next physical, sooner if needed  Schedule your DEXA in November  Be well, Dr. Ardelia Mems

## 2019-12-20 NOTE — Progress Notes (Signed)
    SUBJECTIVE:   CHIEF COMPLAINT / HPI:   Allergic Rhinitis-Patient has chronic, year-round drainage that does not appear to correlate with food intake, and reports rare cough productive of minimal sputum. Patient reports somewhat increased drainage to back of throat and sensation to ears, and would like to consider changes to regimen. Currently using fluticasone nasal spray and Cetirizine. Denies fever.  Osteopenia-Patient would like another DEXA Scan, due to concern regarding family hx of osteoporosis. Last Scan was in 04/11/2018.   GERD-Has not had any symptoms. No issues or concerns. Not taking any medications any longer.  PERTINENT  PMH / PSH: Allergic Rhinitis, Osteopenia, GERD, Hyperlipidemia  OBJECTIVE:   BP (!) 125/60   Pulse 61   Ht 5' 3.5" (1.613 m)   Wt 136 lb 6.4 oz (61.9 kg)   SpO2 98%   BMI 23.78 kg/m    Physical Exam Constitutional:      General: She is not in acute distress.    Appearance: Normal appearance. She is normal weight.  HENT:     Head: Normocephalic and atraumatic.     Right Ear: Tympanic membrane normal.     Left Ear: Tympanic membrane normal.     Nose: Rhinorrhea present.  Cardiovascular:     Rate and Rhythm: Normal rate and regular rhythm.  Pulmonary:     Effort: Pulmonary effort is normal.     Breath sounds: Normal breath sounds.  Musculoskeletal:     Cervical back: Normal range of motion and neck supple. No tenderness.  Neurological:     Mental Status: She is alert.     ASSESSMENT/PLAN:   Health Maintenance CMP and Lipid Panel at this visit  Allergic rhinitis Significant room for improvement to symptomology. As patient has tried Zyrtec regimen for an extensive period of time, with limited relief, advised to try switching to Loratadine; and adding Azelastine HCl nasal spray to regimen. Will evaluate for improvement at next visit, and consider Allergist referral if no improvement.   Osteopenia Advised patient to schedule her next  DEXA scan for November, so as to allow a two year period between scans.   GERD (gastroesophageal reflux disease) Resolved at present. Assess for symptoms at next visit.  Hyperlipidemia Update lipids today, adjust crestor pending results.   [place]    Patient seen along with MS3 student Romeo Apple. I personally evaluated this patient along with the student, and verified all aspects of the history, physical exam, and medical decision making as documented by the student. I agree with the student's documentation and have made all necessary edits.  Chrisandra Netters, MD  [place]

## 2019-12-21 LAB — CMP14+EGFR
ALT: 40 IU/L — ABNORMAL HIGH (ref 0–32)
AST: 28 IU/L (ref 0–40)
Albumin/Globulin Ratio: 2 (ref 1.2–2.2)
Albumin: 4.7 g/dL (ref 3.8–4.8)
Alkaline Phosphatase: 98 IU/L (ref 48–121)
BUN/Creatinine Ratio: 16 (ref 12–28)
BUN: 15 mg/dL (ref 8–27)
Bilirubin Total: 0.4 mg/dL (ref 0.0–1.2)
CO2: 25 mmol/L (ref 20–29)
Calcium: 9.9 mg/dL (ref 8.7–10.3)
Chloride: 103 mmol/L (ref 96–106)
Creatinine, Ser: 0.91 mg/dL (ref 0.57–1.00)
GFR calc Af Amer: 76 mL/min/{1.73_m2} (ref >59)
GFR calc non Af Amer: 66 mL/min/{1.73_m2} (ref >59)
Globulin, Total: 2.3 g/dL (ref 1.5–4.5)
Glucose: 93 mg/dL (ref 65–99)
Potassium: 5 mmol/L (ref 3.5–5.2)
Sodium: 142 mmol/L (ref 134–144)
Total Protein: 7 g/dL (ref 6.0–8.5)

## 2019-12-21 LAB — LIPID PANEL
Chol/HDL Ratio: 4.5 ratio — ABNORMAL HIGH (ref 0.0–4.4)
Cholesterol, Total: 273 mg/dL — ABNORMAL HIGH (ref 100–199)
HDL: 61 mg/dL (ref >39)
LDL Chol Calc (NIH): 192 mg/dL — ABNORMAL HIGH (ref 0–99)
Triglycerides: 114 mg/dL (ref 0–149)
VLDL Cholesterol Cal: 20 mg/dL (ref 5–40)

## 2019-12-21 NOTE — Assessment & Plan Note (Signed)
Significant room for improvement to symptomology. As patient has tried Zyrtec regimen for an extensive period of time, with limited relief, advised to try switching to Loratadine; and adding Azelastine HCl nasal spray to regimen. Will evaluate for improvement at next visit, and consider Allergist referral if no improvement.

## 2019-12-21 NOTE — Assessment & Plan Note (Signed)
Resolved at present. Assess for symptoms at next visit.

## 2019-12-21 NOTE — Assessment & Plan Note (Signed)
Advised patient to schedule her next DEXA scan for November, so as to allow a two year period between scans.

## 2019-12-24 NOTE — Assessment & Plan Note (Signed)
Update lipids today, adjust crestor pending results.

## 2019-12-26 ENCOUNTER — Telehealth: Payer: Self-pay

## 2019-12-26 DIAGNOSIS — E785 Hyperlipidemia, unspecified: Secondary | ICD-10-CM

## 2019-12-26 NOTE — Telephone Encounter (Signed)
Patient calls nurse line requesting lab results from recent visit. Patient reports she saw the "high" lipid result on mychart. Patient reports she has not been taking her Crestor as she should, maybe two times per week. Patient reports she started taking it diligently on Friday 7/30 after seeing result. Patient doesn't know if she should continue to take daily for a few weeks and then have labs redrawn. Please advise.

## 2019-12-28 IMAGING — CR DG CHEST 2V
2 series · 2 of 2 positions shown · non-contrast
Comparison: Chest radiograph dated 08/10/2006

CLINICAL DATA: Chronic cough

EXAM:
CHEST - 2 VIEW

[w chest pa]
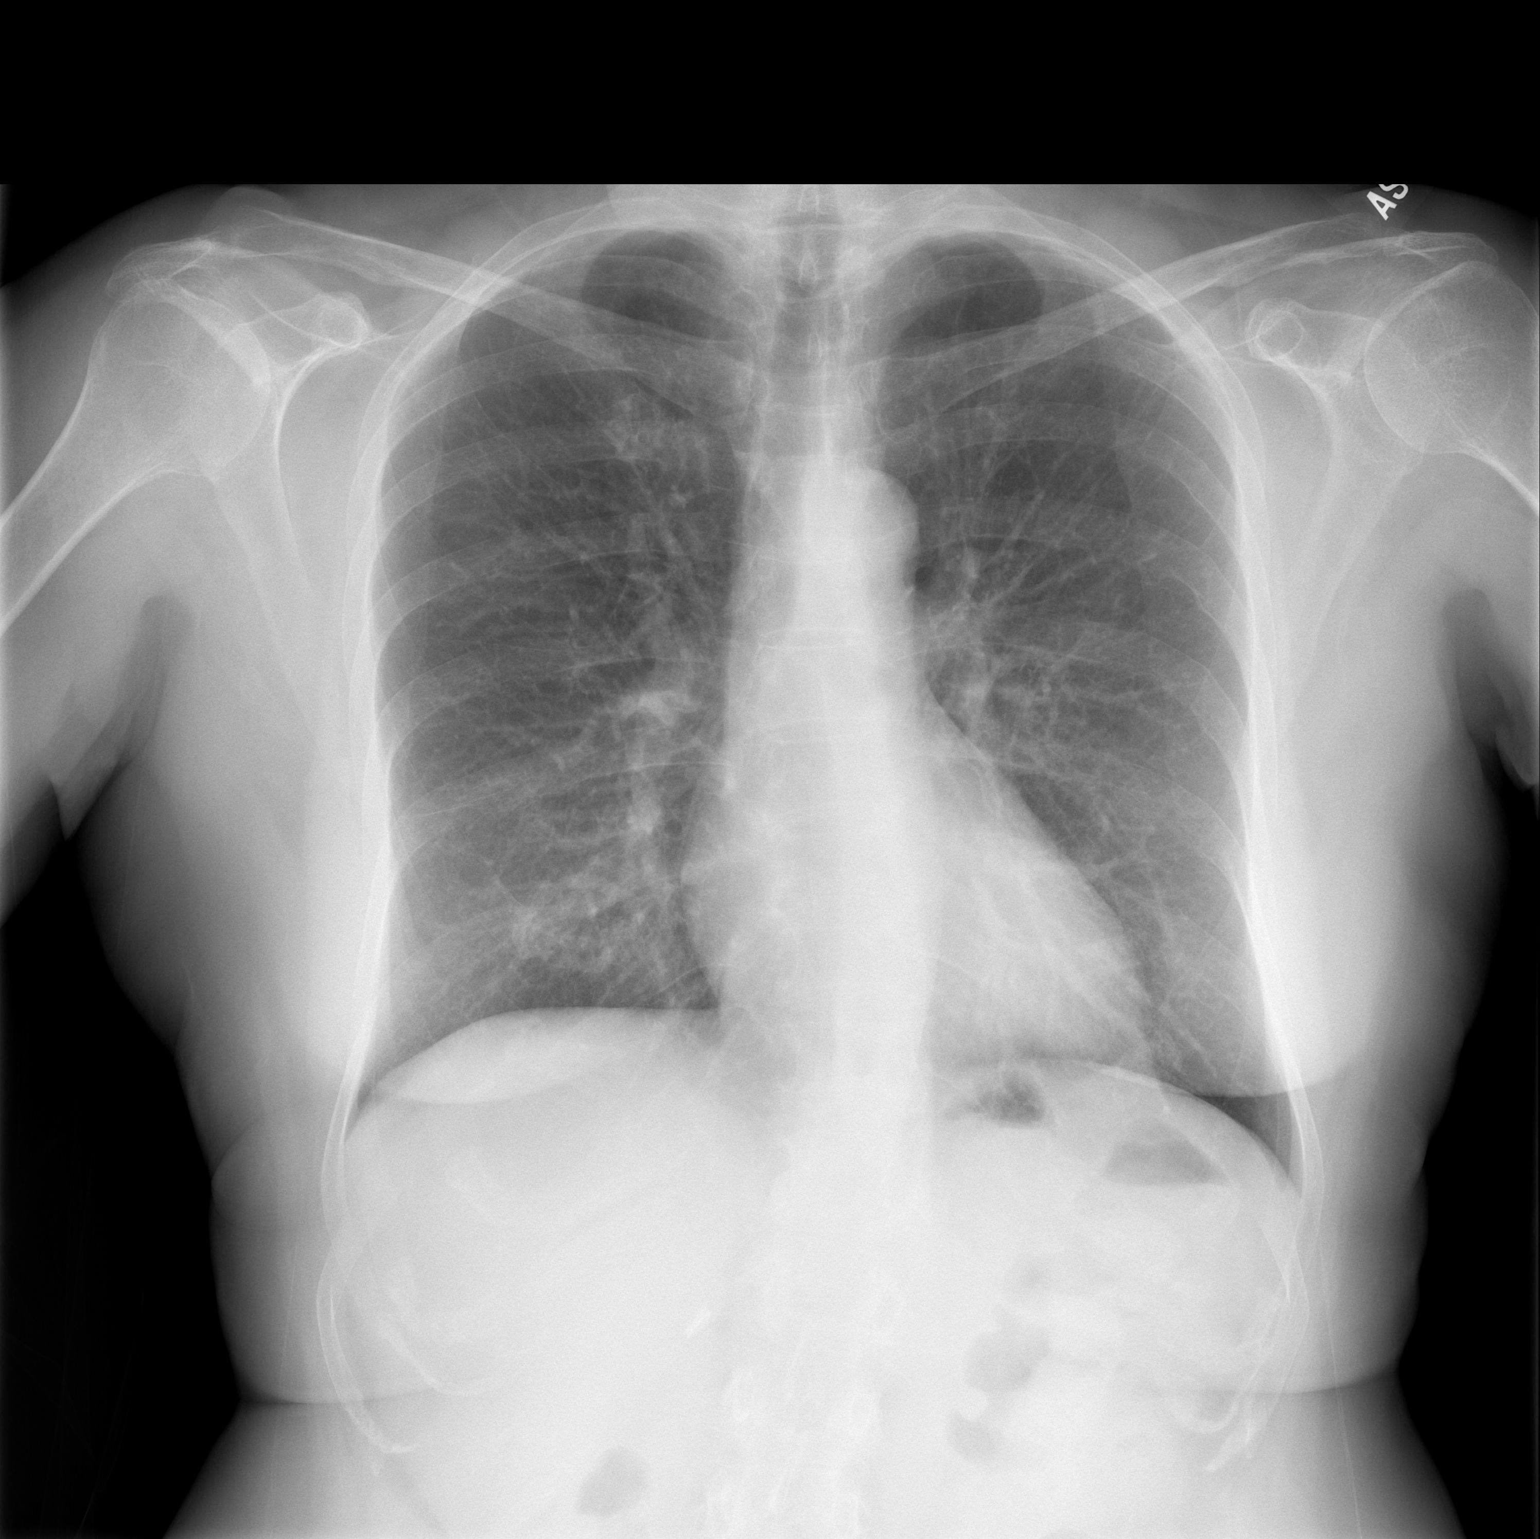

[w chest lat]
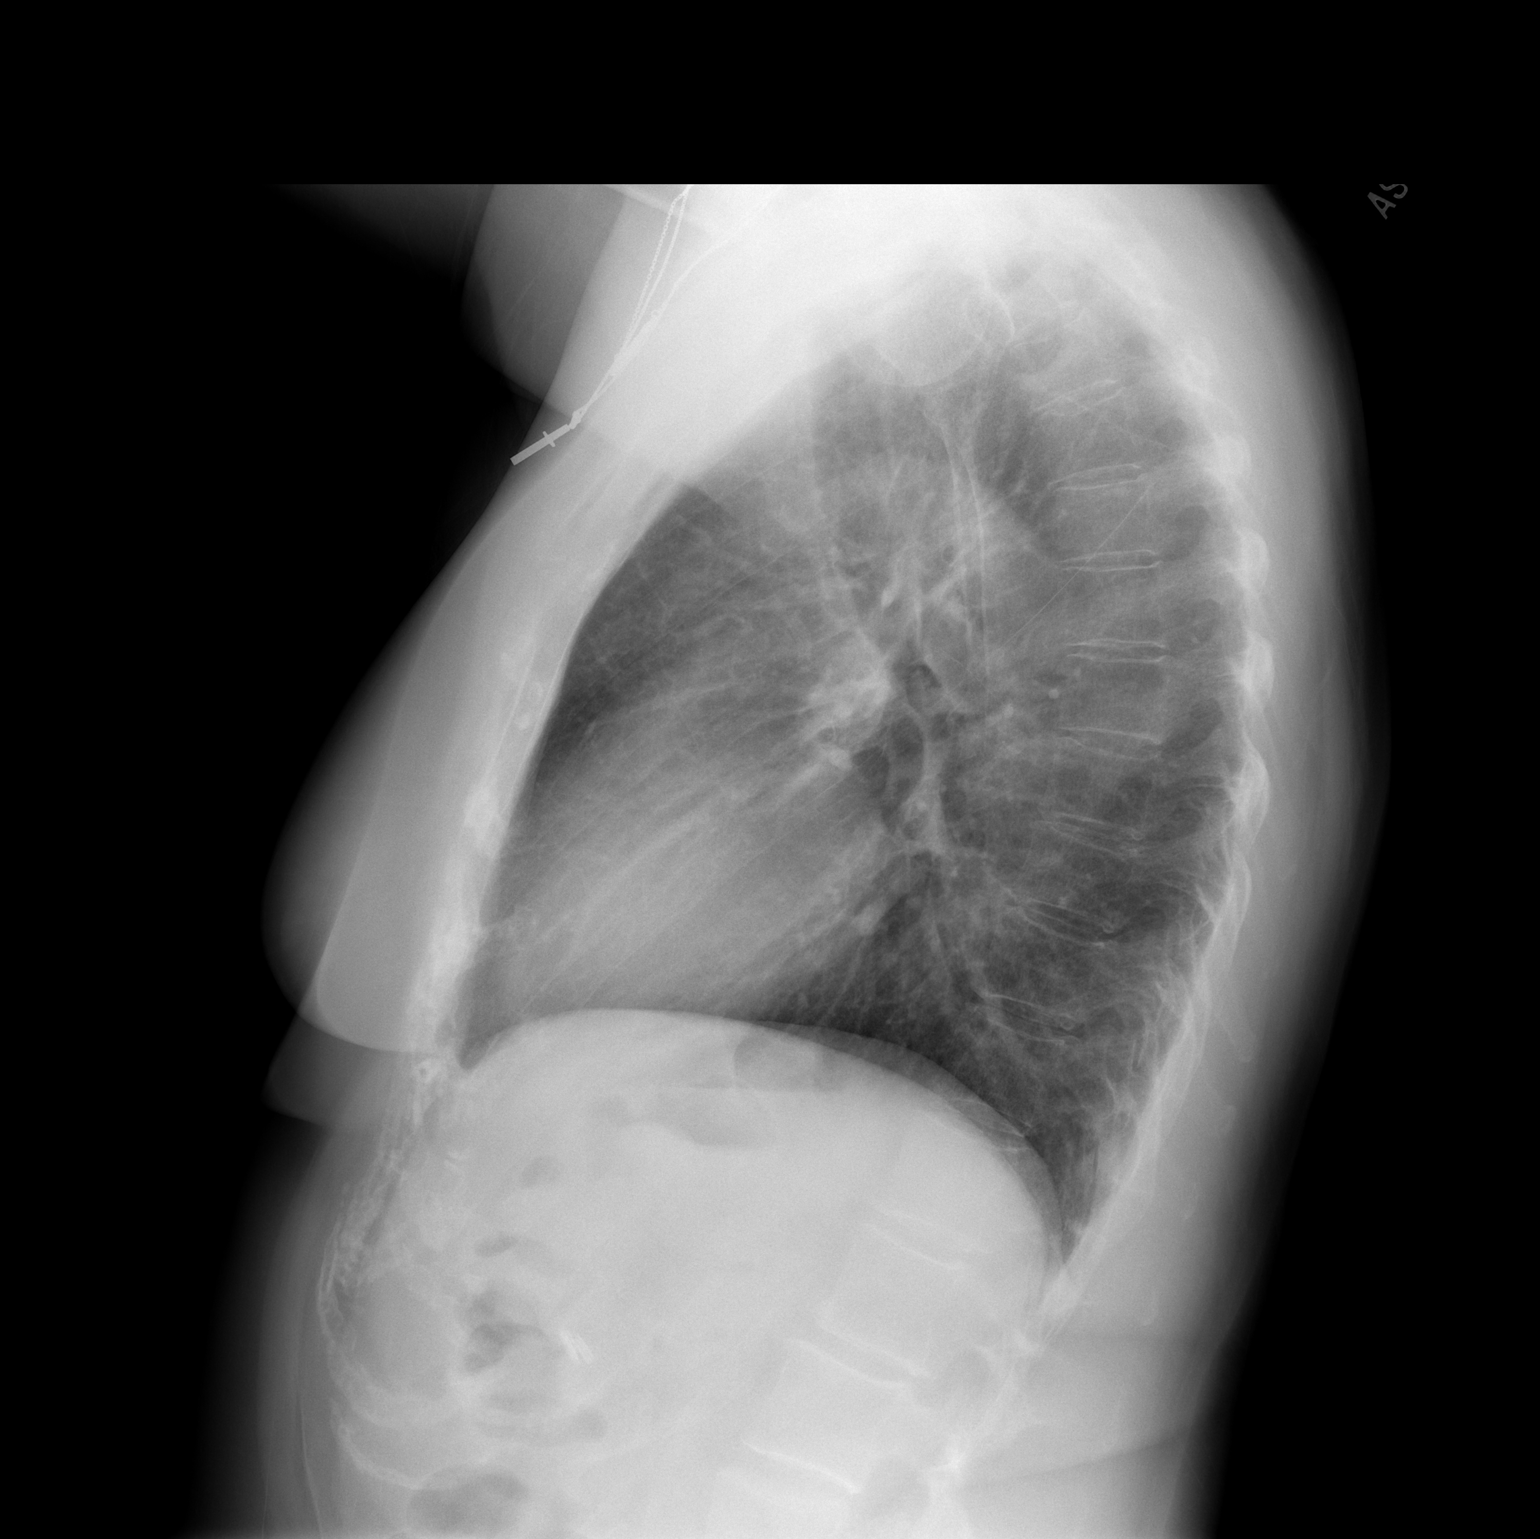

[2 of 2 positions shown; findings below may reference images not displayed]

FINDINGS: The heart size and mediastinal contours are within normal limits.
Both lungs are clear. Chronic deformities are seen of multiple
left-sided ribs.
IMPRESSION: No active cardiopulmonary disease.

## 2019-12-28 NOTE — Telephone Encounter (Signed)
Called patient back, she confirms she took crestor sparingly, sometimes 1-2 times a week, sometimes not at all. Advised taking crestor regularly as prescribed and recheck direct LDL in 6 weeks. Lab ordered, patient aware to schedule lab visit  Patient appreciative  Leeanne Rio, MD

## 2020-01-08 DIAGNOSIS — D1801 Hemangioma of skin and subcutaneous tissue: Secondary | ICD-10-CM | POA: Diagnosis not present

## 2020-01-08 DIAGNOSIS — X32XXXS Exposure to sunlight, sequela: Secondary | ICD-10-CM | POA: Diagnosis not present

## 2020-01-08 DIAGNOSIS — L578 Other skin changes due to chronic exposure to nonionizing radiation: Secondary | ICD-10-CM | POA: Diagnosis not present

## 2020-01-08 DIAGNOSIS — L820 Inflamed seborrheic keratosis: Secondary | ICD-10-CM | POA: Diagnosis not present

## 2020-01-08 DIAGNOSIS — D485 Neoplasm of uncertain behavior of skin: Secondary | ICD-10-CM | POA: Diagnosis not present

## 2020-01-08 DIAGNOSIS — L814 Other melanin hyperpigmentation: Secondary | ICD-10-CM | POA: Diagnosis not present

## 2020-01-15 DIAGNOSIS — H43813 Vitreous degeneration, bilateral: Secondary | ICD-10-CM | POA: Diagnosis not present

## 2020-01-15 DIAGNOSIS — H524 Presbyopia: Secondary | ICD-10-CM | POA: Diagnosis not present

## 2020-01-15 DIAGNOSIS — H52203 Unspecified astigmatism, bilateral: Secondary | ICD-10-CM | POA: Diagnosis not present

## 2020-01-15 DIAGNOSIS — H10413 Chronic giant papillary conjunctivitis, bilateral: Secondary | ICD-10-CM | POA: Diagnosis not present

## 2020-02-04 ENCOUNTER — Telehealth: Payer: Self-pay

## 2020-02-04 NOTE — Telephone Encounter (Signed)
Patient calls nurse line reporting facial numbness. Patient reports its more around her ear and has been going on for a couple of weeks. Patient denies any symptoms of facial changes, slurred speech, sudden confusion, or dizziness. Patient reports she is with her sister in law who has been with her and has not notciced any sudden unusual changes in pateint. Patient reports she is under a lot of stress and feels it may be tension related. Patient scheduled for ATC on Wednesday morning with strict ED precautions given. Patient has a lab apt scheduled already for tomorrow for repeat lipid panel. I will cancel this for patient so she does not have to make two trips, will get drawn on Wednesday. Will forward to PCP.

## 2020-02-05 ENCOUNTER — Other Ambulatory Visit: Payer: Medicare HMO

## 2020-02-05 ENCOUNTER — Other Ambulatory Visit: Payer: Self-pay

## 2020-02-05 DIAGNOSIS — E785 Hyperlipidemia, unspecified: Secondary | ICD-10-CM | POA: Diagnosis not present

## 2020-02-06 ENCOUNTER — Ambulatory Visit (HOSPITAL_COMMUNITY)
Admission: RE | Admit: 2020-02-06 | Discharge: 2020-02-06 | Disposition: A | Discharge status: Home / Self Care | Payer: Medicare HMO | Source: Ambulatory Visit | Attending: Family Medicine | Admitting: Family Medicine

## 2020-02-06 ENCOUNTER — Ambulatory Visit (INDEPENDENT_AMBULATORY_CARE_PROVIDER_SITE_OTHER): Payer: Medicare HMO | Admitting: Family Medicine

## 2020-02-06 ENCOUNTER — Other Ambulatory Visit: Payer: Self-pay

## 2020-02-06 VITALS — BP 128/80 | HR 82 | Ht 63.5 in | Wt 140.0 lb

## 2020-02-06 DIAGNOSIS — R079 Chest pain, unspecified: Secondary | ICD-10-CM | POA: Diagnosis not present

## 2020-02-06 DIAGNOSIS — Z23 Encounter for immunization: Secondary | ICD-10-CM

## 2020-02-06 DIAGNOSIS — R200 Anesthesia of skin: Secondary | ICD-10-CM

## 2020-02-06 DIAGNOSIS — F439 Reaction to severe stress, unspecified: Secondary | ICD-10-CM | POA: Diagnosis not present

## 2020-02-06 DIAGNOSIS — R69 Illness, unspecified: Secondary | ICD-10-CM | POA: Diagnosis not present

## 2020-02-06 DIAGNOSIS — R0789 Other chest pain: Secondary | ICD-10-CM

## 2020-02-06 LAB — LDL CHOLESTEROL, DIRECT: LDL Direct: 86 mg/dL (ref 0–99)

## 2020-02-06 NOTE — Patient Instructions (Signed)
Great to see you today! I am sorry you have been experiencing stress at home. Your examination was normal today, I think your facial numbness may be related to stress or something else.  Please work on stress management techniques such as deep breathing, meditation, exercise.  Therapy is always here if needed.  Please follow-up with your PCP about this next month if you still get the symptoms.  For your chest pressure we did a EKG which was normal.  I also think this is related to anxiety.  If you develop chest pains, dizziness, sweating etc. then please go to the ER immediately for evaluation.   If you experience facial drooping, slurred speech, headaches, vision changes, arm or leg weakness then please go to the ER immediately as these are symptoms of stroke.  Best wishes,  Dr. Posey Pronto

## 2020-02-08 DIAGNOSIS — F439 Reaction to severe stress, unspecified: Secondary | ICD-10-CM | POA: Insufficient documentation

## 2020-02-08 DIAGNOSIS — R200 Anesthesia of skin: Secondary | ICD-10-CM | POA: Insufficient documentation

## 2020-02-08 NOTE — Assessment & Plan Note (Addendum)
Cardiac exam normal today with normal EKG. Pt asymptomatic in clinic. Chest pressure likely due to anxiety from recent life stressors. Low suspicion for ACS or PE. Safety return precautions provided to pt that if she develops chest pain, syncope, sweating, vomiting then she should go to the ER.

## 2020-02-08 NOTE — Progress Notes (Signed)
    SUBJECTIVE:   CHIEF COMPLAINT / HPI:   [name] is a 69 yr old female who presents today for facial numbness  Facial numbness Pt endorses 1 month history of left sided facial numbness over her cheek. Occurs up to 3 times a day. Numbness is no currently present. She has been quite stressed over the past few months with her husband's hospitalization, her mom's new diagnosis of [name] disease and looking after her grandchildren so she thinks this could be the cause. Denies history of stroke or bells palsy. Associated symptoms include chest pressure with palpitations. See below. Denies weight loss, headache, vision changes, facial drooping, slurred speech, ataxia, limb weakness, arthralgias, myalgias, paraesthesia elsewhere or hearing changes.   Chest pressure Feels central pressure in her chest when she feels stressed associated with palpitations. Denies chest pain, dyspnea or syncope on exertion. No personal history or family history of cardiac conditions.  PERTINENT  PMH / PSH: Allergic rhinitis, GERD, HLD  OBJECTIVE:   BP 128/80   Pulse 82   Ht 5' 3.5" (1.613 m)   Wt 140 lb (63.5 kg)   SpO2 98%   BMI 24.41 kg/m    General: Alert, pleasant, no acute distress HEENT: Neck non-tender without lymphadenopathy, masses or thyromegaly.  Cardio: Normal S1 and S2, RRR Pulm: CTAB, Normal respiratory effort Extremities: No peripheral edema. Neuro: Cranial nerve exam normal, no facial numbness present   ASSESSMENT/PLAN:   Left facial numbness Patient presents with 1 month history of intermittent numbness in the distrubution of maxillary nerve. Unclear etiology of symptoms. Considered CVA however symptoms are subacute and no other neurological symptoms. Also considered Bells Palsy however no evidence of unilateral facial paralysis on exam. Considered parotid tumor however HEENT exam normal, no unintentional weight loss or other symptoms. Unlikely to be trigeminal neuralgia as  would expect sudden, severe facial pains. Could be due to patient's recent stressors. Discussed stress management options with pt such as therapy, meditation breath work etc. Pt declined therapy today but will work on ways to manage her stress. She has an appointment coming up with Dr Ardelia Mems in 3 weeks which I recommended she keeps. If pt has persistent symptoms would consider neurology referral and CT head.    Stress Cardiac exam normal today with normal EKG. Pt asymptomatic in clinic. Chest pressure likely due to anxiety from recent life stressors. Low suspicion for ACS or PE. Safety return precautions provided to pt that if she develops chest pain, syncope, sweating, vomiting then she should go to the ER.     April Haw, MD PGY-2 [place]

## 2020-02-08 NOTE — Assessment & Plan Note (Signed)
Patient presents with 1 month history of intermittent numbness in the distrubution of maxillary nerve. Unclear etiology of symptoms. Considered CVA however symptoms are subacute and no other neurological symptoms. Also considered Bells Palsy however no evidence of unilateral facial paralysis on exam. Considered parotid tumor however HEENT exam normal, no unintentional weight loss or other symptoms. Unlikely to be trigeminal neuralgia as would expect sudden, severe facial pains. Could be due to patient's recent stressors. Discussed stress management options with pt such as therapy, meditation breath work etc. Pt declined therapy today but will work on ways to manage her stress. She has an appointment coming up with Dr Ardelia Mems in 3 weeks which I recommended she keeps. If pt has persistent symptoms would consider neurology referral and CT head.

## 2020-02-12 DIAGNOSIS — L820 Inflamed seborrheic keratosis: Secondary | ICD-10-CM | POA: Diagnosis not present

## 2020-02-28 ENCOUNTER — Ambulatory Visit: Payer: Medicare HMO | Admitting: Family Medicine

## 2020-03-18 DIAGNOSIS — R69 Illness, unspecified: Secondary | ICD-10-CM | POA: Diagnosis not present

## 2020-04-21 ENCOUNTER — Encounter: Payer: Self-pay | Admitting: Family Medicine

## 2020-04-21 DIAGNOSIS — M8589 Other specified disorders of bone density and structure, multiple sites: Secondary | ICD-10-CM | POA: Diagnosis not present

## 2020-04-21 DIAGNOSIS — Z1231 Encounter for screening mammogram for malignant neoplasm of breast: Secondary | ICD-10-CM | POA: Diagnosis not present

## 2020-05-09 ENCOUNTER — Encounter: Payer: Self-pay | Admitting: Family Medicine

## 2020-05-12 DIAGNOSIS — Z0100 Encounter for examination of eyes and vision without abnormal findings: Secondary | ICD-10-CM | POA: Diagnosis not present

## 2020-05-22 ENCOUNTER — Ambulatory Visit: Payer: Medicare HMO | Admitting: Family Medicine

## 2020-06-11 DIAGNOSIS — L814 Other melanin hyperpigmentation: Secondary | ICD-10-CM | POA: Diagnosis not present

## 2020-06-11 DIAGNOSIS — X32XXXS Exposure to sunlight, sequela: Secondary | ICD-10-CM | POA: Diagnosis not present

## 2020-06-11 DIAGNOSIS — L821 Other seborrheic keratosis: Secondary | ICD-10-CM | POA: Diagnosis not present

## 2020-06-17 ENCOUNTER — Other Ambulatory Visit: Payer: Self-pay | Admitting: Family Medicine

## 2020-09-04 ENCOUNTER — Ambulatory Visit (INDEPENDENT_AMBULATORY_CARE_PROVIDER_SITE_OTHER): Payer: Medicare HMO

## 2020-09-04 ENCOUNTER — Other Ambulatory Visit: Payer: Self-pay

## 2020-09-04 DIAGNOSIS — Z23 Encounter for immunization: Secondary | ICD-10-CM

## 2020-09-04 NOTE — Progress Notes (Signed)
   Covid-19 Vaccination Clinic  Name:  TAIMI TOWE    MRN: [id number] DOB: [birth year]  09/04/2020  Ms. Chacko was observed post Covid-19 immunization for 15 minutes without incident. She was provided with Vaccine Information Sheet and instruction to access the V-Safe system.   Ms. Tesoriero was instructed to call 911 with any severe reactions post vaccine: Marland Kitchen Difficulty breathing  . Swelling of face and throat  . A fast heartbeat  . A bad rash all over body  . Dizziness and weakness   Immunizations Administered    Name Date Dose VIS Date Route   PFIZER Comrnaty([place] TOP) Covid-19 Vaccine 09/04/2020 10:28 AM 0.3 mL 05/01/2020 Intramuscular   Manufacturer: [place]   Lot: [id number]   [place]: [phone]

## 2020-12-05 ENCOUNTER — Other Ambulatory Visit: Payer: Self-pay | Admitting: Family Medicine

## 2020-12-30 ENCOUNTER — Other Ambulatory Visit: Payer: Self-pay

## 2020-12-30 ENCOUNTER — Encounter: Payer: Self-pay | Admitting: Family Medicine

## 2020-12-30 ENCOUNTER — Ambulatory Visit (INDEPENDENT_AMBULATORY_CARE_PROVIDER_SITE_OTHER): Payer: Medicare HMO | Admitting: Family Medicine

## 2020-12-30 VITALS — BP 146/79 | HR 70 | Wt 139.6 lb

## 2020-12-30 DIAGNOSIS — E785 Hyperlipidemia, unspecified: Secondary | ICD-10-CM

## 2020-12-30 DIAGNOSIS — R053 Chronic cough: Secondary | ICD-10-CM

## 2020-12-30 DIAGNOSIS — J301 Allergic rhinitis due to pollen: Secondary | ICD-10-CM

## 2020-12-30 DIAGNOSIS — K219 Gastro-esophageal reflux disease without esophagitis: Secondary | ICD-10-CM

## 2020-12-30 DIAGNOSIS — M8589 Other specified disorders of bone density and structure, multiple sites: Secondary | ICD-10-CM | POA: Diagnosis not present

## 2020-12-30 MED ORDER — SHINGRIX 50 MCG/0.5ML IM SUSR: 0.5000 mL | Freq: Once | INTRAMUSCULAR | 1 refills | Status: AC

## 2020-12-30 MED ORDER — FAMOTIDINE 20 MG PO TABS: 20.0000 mg | ORAL_TABLET | Freq: Two times a day (BID) | ORAL | 1 refills | Status: DC

## 2020-12-30 MED ORDER — PANTOPRAZOLE SODIUM 20 MG PO TBEC: 20.0000 mg | DELAYED_RELEASE_TABLET | Freq: Every day | ORAL | 1 refills | Status: DC

## 2020-12-30 NOTE — Assessment & Plan Note (Deleted)
Well controlled on current medication regimen of azelastine, flucatisone and ceterizine

## 2020-12-30 NOTE — Assessment & Plan Note (Addendum)
Well controlled on Crestor. Obtaining annual lipid panel.

## 2020-12-30 NOTE — Progress Notes (Addendum)
    SUBJECTIVE:   CHIEF COMPLAINT / HPI:   [name] presents for routine follow up today.  Reflux: Improved significantly and was well controlled on pantoprazole and famotidine. Now only experiences flares once a month. Uses pantoprazole and famotidine when flares occur but has run out. Main symptoms are chronic cough which has re-emerged after being out of PPI/H2 blocker.   Hyperlipidemia: No concerns today. Taking Crestor as prescribed, '10mg'$  daily. Would like lipid levels to be checked today to monitor changes.  Sciatica: Primarily sharing so Dr. Ardelia Mems is aware. Had an episode of hip pain/back pain that radiated midthigh. She found on Google that sciatic pain could be the reason and did recommended exercises she found. Pain resolved and has not bothered her since.  Health Maintenance: [name] wants to confirm her eligibility for Shingrix and Pneumococcal vaccines.  PERTINENT  PMH / PSH:  Osteopenia Allergic rhinitis GERD Hyperlipidemia Chronic cough  OBJECTIVE:   BP (!) 146/79   Pulse 70   Wt 139 lb 9.6 oz (63.3 kg)   SpO2 98%   BMI 24.34 kg/m   GEN: Well appearing, in no acute distress CARD: Normal rate and rhythm, no murmurs or gallops PULM: Clear to ascultation bilaterally, normal work of breathing SKIN: Warm and dry GI: Soft, nontender, no distention. No rebound or garuding EXTREMITIES: No edema   ASSESSMENT/PLAN:   Hyperlipidemia Well controlled on Crestor. Obtaining annual lipid panel.  Chronic cough Worsened over the past few months. Suspect reflux as cause based on previous history and prior relief with pantoprazole and famotidine.  Given concern for exacerbation of osteopenia, recommending daily famotidine for control of cough but ok to do PPI if necessary to control symptoms   GERD (gastroesophageal reflux disease) Has flares once every month. Uses famotidine and pantoprazole as needed. Sent refill for famotidine and PPI  Osteopenia No  concerns today. Adjusted medication regimen to minimize impact to bone health. Continuing daily Vit D supplement..   Health Maintenance - Provided prescription for Shingrix -otherwise UTD on HM items  BP noted elevated, patient will monitor at home and let me know if consistently > 140/90.  [place]    Patient seen along with MS3 student [name]. I personally evaluated this patient along with the student, and verified all aspects of the history, physical exam, and medical decision making as documented by the student. I agree with the student's documentation and have made all necessary edits.  Chrisandra Netters, MD  [place]

## 2020-12-30 NOTE — Assessment & Plan Note (Addendum)
No concerns today. Adjusted medication regimen to minimize impact to bone health. Continuing daily Vit D supplement.

## 2020-12-30 NOTE — Assessment & Plan Note (Addendum)
Has flares once every month. Uses famotidine and pantoprazole as needed. Sent refill for famotidine and PPI

## 2020-12-30 NOTE — Patient Instructions (Signed)
Refilled famotidine and pantoprazole for you Ok to take famotidine every day Do the pantoprazole just as needed - ok to do every day if you need it  Take shingles shot prescription to your pharmacy  Checking cholesterol, kidneys, and liver today  Be well, Dr. Ardelia Mems

## 2020-12-30 NOTE — Assessment & Plan Note (Addendum)
Worsened over the past few months. Suspect reflux as cause based on previous history and prior relief with pantoprazole and famotidine.  Given concern for exacerbation of osteopenia, recommending daily famotidine for control of cough but ok to do PPI if necessary to control symptoms

## 2020-12-31 LAB — CMP14+EGFR
ALT: 30 IU/L (ref 0–32)
AST: 19 IU/L (ref 0–40)
Albumin/Globulin Ratio: 2 (ref 1.2–2.2)
Albumin: 4.6 g/dL (ref 3.8–4.8)
Alkaline Phosphatase: 81 IU/L (ref 44–121)
BUN/Creatinine Ratio: 19 (ref 12–28)
BUN: 13 mg/dL (ref 8–27)
Bilirubin Total: 0.3 mg/dL (ref 0.0–1.2)
CO2: 22 mmol/L (ref 20–29)
Calcium: 9.6 mg/dL (ref 8.7–10.3)
Chloride: 105 mmol/L (ref 96–106)
Creatinine, Ser: 0.7 mg/dL (ref 0.57–1.00)
Globulin, Total: 2.3 g/dL (ref 1.5–4.5)
Glucose: 101 mg/dL — ABNORMAL HIGH (ref 65–99)
Potassium: 4.7 mmol/L (ref 3.5–5.2)
Sodium: 145 mmol/L — ABNORMAL HIGH (ref 134–144)
Total Protein: 6.9 g/dL (ref 6.0–8.5)
eGFR: 95 mL/min/{1.73_m2} (ref >59)

## 2020-12-31 LAB — LIPID PANEL
Chol/HDL Ratio: 3.1 ratio (ref 0.0–4.4)
Cholesterol, Total: 181 mg/dL (ref 100–199)
HDL: 59 mg/dL (ref >39)
LDL Chol Calc (NIH): 99 mg/dL (ref 0–99)
Triglycerides: 130 mg/dL (ref 0–149)
VLDL Cholesterol Cal: 23 mg/dL (ref 5–40)

## 2021-01-13 ENCOUNTER — Encounter: Payer: Self-pay | Admitting: Family Medicine

## 2021-01-13 DIAGNOSIS — I10 Essential (primary) hypertension: Secondary | ICD-10-CM

## 2021-01-14 DIAGNOSIS — I10 Essential (primary) hypertension: Secondary | ICD-10-CM | POA: Insufficient documentation

## 2021-01-14 MED ORDER — HYDROCHLOROTHIAZIDE 12.5 MG PO CAPS: 12.5000 mg | ORAL_CAPSULE | Freq: Every day | ORAL | 2 refills | Status: DC

## 2021-01-14 NOTE — Assessment & Plan Note (Signed)
BPs uncontrolled at home. Start HCTZ 12.'5mg'$  daily. Follow up in office in a few weeks.

## 2021-01-20 DIAGNOSIS — H524 Presbyopia: Secondary | ICD-10-CM | POA: Diagnosis not present

## 2021-01-20 DIAGNOSIS — H10413 Chronic giant papillary conjunctivitis, bilateral: Secondary | ICD-10-CM | POA: Diagnosis not present

## 2021-01-20 DIAGNOSIS — H52203 Unspecified astigmatism, bilateral: Secondary | ICD-10-CM | POA: Diagnosis not present

## 2021-01-22 NOTE — Telephone Encounter (Signed)
Patient calls nurse line in regards to recent blood pressures. Patient reports she has been taking HCTZ 12.'5mg'$ , however when her BP did not improve she called the pharmacy and they instructed her to take (2) 12.'5mg'$ . Patient reports she has been doing this, however is still not seeing any improvement. Patient denies SOB, headaches, or vision changes at this time. Patient has an apt 9/15 with PCP.   Patient is requesting you call her or communicate via mychart.

## 2021-01-23 MED ORDER — AMLODIPINE BESYLATE 5 MG PO TABS: 5.0000 mg | ORAL_TABLET | Freq: Every day | ORAL | 0 refills | Status: DC

## 2021-01-23 NOTE — Telephone Encounter (Signed)
Called patient to discuss Recommend adding amlodipine '5mg'$  daily to HCTZ '25mg'$  daily. She will let me know how her BPs are doing next week She thinks this is stress related, encouraged her to think about establishing with a counselor She has follow up with me in a few weeks in the office  Leeanne Rio, MD

## 2021-02-05 ENCOUNTER — Other Ambulatory Visit: Payer: Self-pay

## 2021-02-05 ENCOUNTER — Ambulatory Visit (INDEPENDENT_AMBULATORY_CARE_PROVIDER_SITE_OTHER): Payer: Medicare HMO | Admitting: Family Medicine

## 2021-02-05 ENCOUNTER — Encounter: Payer: Self-pay | Admitting: Family Medicine

## 2021-02-05 VITALS — BP 134/63 | HR 60 | Ht 63.0 in | Wt 137.8 lb

## 2021-02-05 DIAGNOSIS — Z23 Encounter for immunization: Secondary | ICD-10-CM | POA: Diagnosis not present

## 2021-02-05 DIAGNOSIS — K219 Gastro-esophageal reflux disease without esophagitis: Secondary | ICD-10-CM

## 2021-02-05 DIAGNOSIS — I10 Essential (primary) hypertension: Secondary | ICD-10-CM

## 2021-02-05 MED ORDER — PANTOPRAZOLE SODIUM 40 MG PO TBEC: 40.0000 mg | DELAYED_RELEASE_TABLET | Freq: Every day | ORAL | 1 refills | Status: DC

## 2021-02-05 NOTE — Progress Notes (Signed)
  Date of Visit: 02/05/2021   SUBJECTIVE:   HPI:  [name] presents today for blood pressure follow up.  BP noted high at last visit; patient did home monitoring and blood pressures were persistently elevated. Started on HCTZ 12.'5mg'$  daily which did not control blood pressures, increased to '25mg'$  daily still with elevated pressures. On 9/2 we added amlodipine '5mg'$  daily to her regimen. Has been checking blood pressure at home consistently and since adding amlodipine   Denies dizziness, persistent headache, blurry vision, shortness of breath, or lower extremity swelling.  GERD - taking '20mg'$  protonix daily. Prefers '40mg'$  dose, would like to go up on this.  OBJECTIVE:   BP 134/63   Pulse 60   Ht '5\' 3"'$  (1.6 m)   Wt 137 lb 12.8 oz (62.5 kg)   SpO2 97%   BMI 24.41 kg/m  Gen: no acute distress, pleasant, cooperative HEENT: normocephalic, atraumatic  Heart: regular rate and rhythm, no murmur Lungs: clear to auscultation bilaterally, normal work of breathing  Neuro: alert, speech normal, grossly nonfocal Ext: No appreciable lower extremity edema bilaterally   ASSESSMENT/PLAN:   Health maintenance:  -current on HM items  GERD (gastroesophageal reflux disease) Still with persistent GERD symptoms. Increase protonix to '40mg'$  daily.  Hypertension Blood pressure at goal today Continue present regimen Discussed methods of accurately taking blood pressure at home, including resting in sitting position for several minutes prior to taking measurement. Update BMET today to monitor K, Cr since adding HCTZ.  FOLLOW UP: Follow up in 6 mos for routine problems, sooner as needed.  [place]. Ardelia Mems, [place]

## 2021-02-05 NOTE — Assessment & Plan Note (Signed)
Still with persistent GERD symptoms. Increase protonix to '40mg'$  daily.

## 2021-02-05 NOTE — Patient Instructions (Signed)
It was great to see you again today!  Continue blood pressure medications  Checking labs today  Go up to '40mg'$  of protonix daily  Be well, Dr. Ardelia Mems

## 2021-02-06 LAB — BASIC METABOLIC PANEL
BUN/Creatinine Ratio: 21 (ref 12–28)
BUN: 15 mg/dL (ref 8–27)
CO2: 25 mmol/L (ref 20–29)
Calcium: 9.7 mg/dL (ref 8.7–10.3)
Chloride: 101 mmol/L (ref 96–106)
Creatinine, Ser: 0.7 mg/dL (ref 0.57–1.00)
Glucose: 97 mg/dL (ref 65–99)
Potassium: 4 mmol/L (ref 3.5–5.2)
Sodium: 141 mmol/L (ref 134–144)
eGFR: 95 mL/min/{1.73_m2} (ref >59)

## 2021-02-07 ENCOUNTER — Other Ambulatory Visit: Payer: Self-pay | Admitting: Family Medicine

## 2021-02-09 ENCOUNTER — Encounter: Payer: Self-pay | Admitting: Family Medicine

## 2021-02-09 ENCOUNTER — Other Ambulatory Visit: Payer: Self-pay | Admitting: Family Medicine

## 2021-02-10 NOTE — Assessment & Plan Note (Signed)
Blood pressure at goal today Continue present regimen Discussed methods of accurately taking blood pressure at home, including resting in sitting position for several minutes prior to taking measurement. Update BMET today to monitor K, Cr since adding HCTZ.

## 2021-02-23 DIAGNOSIS — D2272 Melanocytic nevi of left lower limb, including hip: Secondary | ICD-10-CM | POA: Diagnosis not present

## 2021-02-23 DIAGNOSIS — L570 Actinic keratosis: Secondary | ICD-10-CM | POA: Diagnosis not present

## 2021-02-23 DIAGNOSIS — L814 Other melanin hyperpigmentation: Secondary | ICD-10-CM | POA: Diagnosis not present

## 2021-02-23 DIAGNOSIS — D1801 Hemangioma of skin and subcutaneous tissue: Secondary | ICD-10-CM | POA: Diagnosis not present

## 2021-02-23 DIAGNOSIS — L821 Other seborrheic keratosis: Secondary | ICD-10-CM | POA: Diagnosis not present

## 2021-02-23 DIAGNOSIS — D485 Neoplasm of uncertain behavior of skin: Secondary | ICD-10-CM | POA: Diagnosis not present

## 2021-03-03 ENCOUNTER — Ambulatory Visit (INDEPENDENT_AMBULATORY_CARE_PROVIDER_SITE_OTHER): Payer: Medicare HMO

## 2021-03-03 ENCOUNTER — Other Ambulatory Visit: Payer: Self-pay

## 2021-03-03 DIAGNOSIS — Z23 Encounter for immunization: Secondary | ICD-10-CM

## 2021-03-06 DIAGNOSIS — Z0100 Encounter for examination of eyes and vision without abnormal findings: Secondary | ICD-10-CM | POA: Diagnosis not present

## 2021-04-19 ENCOUNTER — Other Ambulatory Visit: Payer: Self-pay | Admitting: Family Medicine

## 2021-04-22 NOTE — Telephone Encounter (Signed)
Patient calls nurse line to check status of rx refill.   Talbot Grumbling, RN

## 2021-04-27 DIAGNOSIS — Z1231 Encounter for screening mammogram for malignant neoplasm of breast: Secondary | ICD-10-CM | POA: Diagnosis not present

## 2021-04-29 DIAGNOSIS — L570 Actinic keratosis: Secondary | ICD-10-CM | POA: Diagnosis not present

## 2021-04-30 ENCOUNTER — Other Ambulatory Visit: Payer: Self-pay

## 2021-04-30 ENCOUNTER — Encounter: Payer: Self-pay | Admitting: Family Medicine

## 2021-04-30 ENCOUNTER — Ambulatory Visit (INDEPENDENT_AMBULATORY_CARE_PROVIDER_SITE_OTHER): Payer: Medicare HMO | Admitting: Family Medicine

## 2021-04-30 DIAGNOSIS — R928 Other abnormal and inconclusive findings on diagnostic imaging of breast: Secondary | ICD-10-CM | POA: Diagnosis not present

## 2021-04-30 DIAGNOSIS — K219 Gastro-esophageal reflux disease without esophagitis: Secondary | ICD-10-CM

## 2021-04-30 NOTE — Assessment & Plan Note (Signed)
She suggested trying pantoprozole at night.  I endorsed this trial and lack confidence that it will work. Educated that meds do not prevent reflux, they only decrease the acid burn of reflux.   I suggested she elevate the head of her bed. If these fail, I explained there is a surgical option (laporascopic fundoplication) that she could consider.

## 2021-04-30 NOTE — Progress Notes (Signed)
    SUBJECTIVE:   CHIEF COMPLAINT / HPI:   Telephone encounter Patient on both famotidine and pantoprozole for reflux.  Still wakes up 3-5 nights per week with fluid in mouth, runs to vomit and then has severe epigastric pain.  She has been having this problem off and on since cholecystectomy in 2019.      OBJECTIVE:   There were no vitals taken for this visit.  No exam done Duration of phone call was 14 minutes. ASSESSMENT/PLAN:   GERD (gastroesophageal reflux disease) She suggested trying pantoprozole at night.  I endorsed this trial and lack confidence that it will work. Educated that meds do not prevent reflux, they only decrease the acid burn of reflux.   I suggested she elevate the head of her bed. If these fail, I explained there is a surgical option (laporascopic fundoplication) that she could consider.       Zenia Resides, MD [place]

## 2021-05-05 ENCOUNTER — Other Ambulatory Visit: Payer: Self-pay | Admitting: Family Medicine

## 2021-05-05 NOTE — Progress Notes (Signed)
My location was in the [facility] office during the visit.  I did not ask the patient her location.

## 2021-05-07 ENCOUNTER — Encounter: Payer: Self-pay | Admitting: Family Medicine

## 2021-05-29 ENCOUNTER — Other Ambulatory Visit: Payer: Self-pay | Admitting: Family Medicine

## 2021-06-16 DIAGNOSIS — L820 Inflamed seborrheic keratosis: Secondary | ICD-10-CM | POA: Diagnosis not present

## 2021-06-16 DIAGNOSIS — L578 Other skin changes due to chronic exposure to nonionizing radiation: Secondary | ICD-10-CM | POA: Diagnosis not present

## 2021-06-16 DIAGNOSIS — L814 Other melanin hyperpigmentation: Secondary | ICD-10-CM | POA: Diagnosis not present

## 2021-06-16 DIAGNOSIS — D1801 Hemangioma of skin and subcutaneous tissue: Secondary | ICD-10-CM | POA: Diagnosis not present

## 2021-06-16 DIAGNOSIS — L570 Actinic keratosis: Secondary | ICD-10-CM | POA: Diagnosis not present

## 2021-07-15 ENCOUNTER — Other Ambulatory Visit: Payer: Self-pay | Admitting: Family Medicine

## 2021-07-22 ENCOUNTER — Other Ambulatory Visit: Payer: Self-pay | Admitting: *Deleted

## 2021-07-22 MED ORDER — FAMOTIDINE 20 MG PO TABS: 20.0000 mg | ORAL_TABLET | Freq: Two times a day (BID) | ORAL | 1 refills | Status: DC

## 2021-07-22 NOTE — Telephone Encounter (Signed)
Patient called to check on her script refill.  She requested this on 07/15/21 through her pharmacy but it wasn't filled.  She is out of the medication.  I explained to her that there is a covering provider who will address this request.  Patient voiced understanding.  [name],CMA ? ?

## 2021-08-22 ENCOUNTER — Other Ambulatory Visit: Payer: Self-pay | Admitting: Family Medicine

## 2021-09-02 NOTE — Progress Notes (Signed)
?  Date of Visit: 09/03/2021  ? ?SUBJECTIVE:  ? ?HPI: ? ?[name] presents today for routine follow up. ? ?Hypertension - taking amlodipine '5mg'$  daily and HCTZ '25mg'$  daily. Blood pressures measured at home tend to run in 140s/90s, she attributes this to increased stress lately. ? ?Stress - has been under a lot of stress lately and is struggling. Cares for her husband, also has had losses in her family over the last year. Anticipates some situations will improve soon but for now is having a hard time. Denies SI/HI. Previously took zoloft for depression/anxiety years ago and did well on it. Denies ever being hospitalized for mental health in the past. ? ?Allergies - taking zyrtec and azelastine nasal spray, doing well with this regimen.  ? ?OBJECTIVE:  ? ?BP 130/82   Pulse 66   Temp 98 ?F (36.7 ?C)   Ht '5\' 4"'$  (1.626 m)   Wt 139 lb 9.6 oz (63.3 kg)   SpO2 96%   BMI 23.96 kg/m?  ?Gen: no acute distress, pleasant, cooperative ?HEENT: normocephalic, atraumatic  ?Heart: regular rate and rhythm, no murmur ?Lungs: clear to auscultation bilaterally, normal work of breathing  ?Neuro: alert, speech normal, grossly nonfocal ?Ext: No appreciable lower extremity edema bilaterally ?Psych: normal range of affect, well groomed, speech normal in rate and volume, normal eye contact  ? ?ASSESSMENT/PLAN:  ? ?Health maintenance:  ?-UTD on HM items ? ?Stress ?Significant stressors lately - caring for husband, grief over loss of family members, other life events ?Discussed therapy vs medication, patient opts for medication.  ?Start sertraline '25mg'$  daily. Follow up 1 month to eval for tolerability and efficacy. ?She'll get in touch if any issues arise between now and then. ? ?Hypertension ?Well controlled. Continue current medication regimen.  ? ?Allergic rhinitis ?Doing well on current regimen of zyrtec and azelastine spray ? ?FOLLOW UP: ?Follow up in 1 mo for stress/mood ? ?[place]. Ardelia Mems, MD ?[facility] ?

## 2021-09-03 ENCOUNTER — Ambulatory Visit (INDEPENDENT_AMBULATORY_CARE_PROVIDER_SITE_OTHER): Payer: Medicare HMO | Admitting: Family Medicine

## 2021-09-03 DIAGNOSIS — J301 Allergic rhinitis due to pollen: Secondary | ICD-10-CM | POA: Diagnosis not present

## 2021-09-03 DIAGNOSIS — I10 Essential (primary) hypertension: Secondary | ICD-10-CM | POA: Diagnosis not present

## 2021-09-03 DIAGNOSIS — F439 Reaction to severe stress, unspecified: Secondary | ICD-10-CM

## 2021-09-03 DIAGNOSIS — R69 Illness, unspecified: Secondary | ICD-10-CM | POA: Diagnosis not present

## 2021-09-03 MED ORDER — SERTRALINE HCL 25 MG PO TABS: 25.0000 mg | ORAL_TABLET | Freq: Every day | ORAL | 0 refills | Status: DC

## 2021-09-03 NOTE — Patient Instructions (Signed)
It was great to see you again today! ? ?Start zoloft '25mg'$  daily ?Follow up with me in 1 month to see how you're doing on this medication ?Please call or message if any issues between now and then ? ?Be well, ?Dr. Ardelia Mems  ?

## 2021-09-03 NOTE — Assessment & Plan Note (Signed)
Well controlled. Continue current medication regimen.  

## 2021-09-03 NOTE — Assessment & Plan Note (Signed)
Doing well on current regimen of zyrtec and azelastine spray ?

## 2021-09-03 NOTE — Assessment & Plan Note (Signed)
Significant stressors lately - caring for husband, grief over loss of family members, other life events ?Discussed therapy vs medication, patient opts for medication.  ?Start sertraline '25mg'$  daily. Follow up 1 month to eval for tolerability and efficacy. ?She'll get in touch if any issues arise between now and then. ?

## 2021-09-16 DIAGNOSIS — L570 Actinic keratosis: Secondary | ICD-10-CM | POA: Diagnosis not present

## 2021-09-16 DIAGNOSIS — L821 Other seborrheic keratosis: Secondary | ICD-10-CM | POA: Diagnosis not present

## 2021-09-29 DIAGNOSIS — M25551 Pain in right hip: Secondary | ICD-10-CM | POA: Diagnosis not present

## 2021-09-29 DIAGNOSIS — M5451 Vertebrogenic low back pain: Secondary | ICD-10-CM | POA: Diagnosis not present

## 2021-10-08 ENCOUNTER — Ambulatory Visit (INDEPENDENT_AMBULATORY_CARE_PROVIDER_SITE_OTHER): Payer: Medicare HMO | Admitting: Family Medicine

## 2021-10-08 ENCOUNTER — Encounter: Payer: Self-pay | Admitting: Family Medicine

## 2021-10-08 DIAGNOSIS — F439 Reaction to severe stress, unspecified: Secondary | ICD-10-CM | POA: Diagnosis not present

## 2021-10-08 DIAGNOSIS — R69 Illness, unspecified: Secondary | ICD-10-CM | POA: Diagnosis not present

## 2021-10-08 MED ORDER — SERTRALINE HCL 50 MG PO TABS: 50.0000 mg | ORAL_TABLET | Freq: Every day | ORAL | 0 refills | Status: DC

## 2021-10-08 NOTE — Assessment & Plan Note (Signed)
Improved, with still room to improve further. Increase sertraline to '50mg'$  daily. Follow up in 1 month to recheck.

## 2021-10-08 NOTE — Progress Notes (Signed)
  Date of Visit: 10/08/2021   SUBJECTIVE:   HPI:  [name] presents today for follow up.  Stress - last visit started sertraline '25mg'$  daily. Has taken this for >4 weeks now and noticed improvement, thinks 50 to 75% better. Denies SI/HI. Things bother her less.  Sciatica - was seen on 5/9 by ortho PA for irritation of R sciatic nerve, prescribed prednisone 10 day taper. She took it for several days and then developed diarrhea which she was concerned was from the prednisone. Diarrhea began 5/13 and lasted 3 days, improved now. Nonbloody, no fever. Eating and drinking well now. She stopped prednisone. Has follow up in place with ortho on Tuesday.  OBJECTIVE:   BP 127/65   Pulse 64   Wt 135 lb 9.6 oz (61.5 kg)   SpO2 96%   BMI 23.28 kg/m  Gen: no acute distress, pleasant, cooperative HEENT: normocephalic, atraumatic, moist mucous membranes  Heart: regular rate and rhythm, no murmur Lungs: clear to auscultation bilaterally, normal work of breathing  Neuro: alert, grossly nonfocal speech normal Abdomen: soft, nontender to palpation, no peritoneal signs  ASSESSMENT/PLAN:   Health maintenance:  -UTD on HM items  Stress Improved, with still room to improve further. Increase sertraline to '50mg'$  daily. Follow up in 1 month to recheck.  Sciatica Follow up with ortho as scheduled, did not address this specifically today  Diarrhea Resolved, suspect was likely viral gastroenteritis rather than side effect of prednisone. Follow up if recurs or worsens.  FOLLOW UP: Follow up in 1 month for stress  [place]. Ardelia Mems, [place]

## 2021-10-08 NOTE — Patient Instructions (Signed)
It was great to see you again today!  Go up to '50mg'$  daily of sertraline Follow up with me in 1 month, sooner if needed  Be well, Dr. Ardelia Mems

## 2021-10-13 DIAGNOSIS — M5451 Vertebrogenic low back pain: Secondary | ICD-10-CM | POA: Diagnosis not present

## 2021-10-27 ENCOUNTER — Encounter: Payer: Self-pay | Admitting: *Deleted

## 2021-10-27 DIAGNOSIS — M5451 Vertebrogenic low back pain: Secondary | ICD-10-CM | POA: Diagnosis not present

## 2021-11-09 ENCOUNTER — Ambulatory Visit: Payer: Medicare HMO | Admitting: Family Medicine

## 2021-11-10 NOTE — Progress Notes (Unsigned)
  Date of Visit: 11/11/2021   SUBJECTIVE:   HPI:  Quanika presents today for follow up of stress.  Stress - at last visit increased sertraline to '50mg'$  daily. Patient reports she is doing very well on this medication. Feels all the way back to normal. Is happy with this dose and prefers to remain on it. Has noticed some weight gain.  OBJECTIVE:   BP 112/72   Pulse (!) 56   Ht '5\' 3"'$  (1.6 m)   Wt 141 lb 3.2 oz (64 kg)   SpO2 98%   BMI 25.01 kg/m  Gen: no acute distress, pleasant, cooperative HEENT: normocephalic, atraumatic  Lungs: normal work of breathing  Neuro: alert, speech normal, grossly nonfocal Psych: normal range of affect, well groomed, speech normal in rate and volume, normal eye contact   ASSESSMENT/PLAN:   Health maintenance:  -UTD on HM items  Stress Doing very well on sertraline '50mg'$  daily. Continue. Discussed weight gain likely more side effect of feeling better and eating more Will monitor weight at future visits Follow up in 3 months for physical, recheck mood at that time.  FOLLOW UP: Follow up in 3 mos for CPE/mood  [place] J. Ardelia Mems, [place]

## 2021-11-11 ENCOUNTER — Encounter: Payer: Self-pay | Admitting: Family Medicine

## 2021-11-11 ENCOUNTER — Ambulatory Visit (INDEPENDENT_AMBULATORY_CARE_PROVIDER_SITE_OTHER): Payer: Medicare HMO | Admitting: Family Medicine

## 2021-11-11 DIAGNOSIS — F439 Reaction to severe stress, unspecified: Secondary | ICD-10-CM

## 2021-11-11 DIAGNOSIS — R69 Illness, unspecified: Secondary | ICD-10-CM | POA: Diagnosis not present

## 2021-11-11 MED ORDER — SERTRALINE HCL 50 MG PO TABS: 50.0000 mg | ORAL_TABLET | Freq: Every day | ORAL | 1 refills | Status: DC

## 2021-11-11 NOTE — Patient Instructions (Signed)
It was great to see you again today!  I'm glad you are feeling better. Follow up in 3 months for a physical and to check in on your mood/stress. We'll do bloodwork at that visit.  Be well, Dr. Ardelia Mems

## 2021-11-11 NOTE — Assessment & Plan Note (Signed)
Doing very well on sertraline '50mg'$  daily. Continue. Discussed weight gain likely more side effect of feeling better and eating more Will monitor weight at future visits Follow up in 3 months for physical, recheck mood at that time.

## 2021-12-16 ENCOUNTER — Other Ambulatory Visit: Payer: Self-pay

## 2021-12-18 MED ORDER — ROSUVASTATIN CALCIUM 10 MG PO TABS: ORAL_TABLET | ORAL | 3 refills | Status: DC

## 2021-12-22 DIAGNOSIS — Z0100 Encounter for examination of eyes and vision without abnormal findings: Secondary | ICD-10-CM | POA: Diagnosis not present

## 2022-01-21 ENCOUNTER — Telehealth: Payer: Self-pay

## 2022-01-21 NOTE — Telephone Encounter (Signed)
Patient calls nurse line regarding elevated BP.   Reports last two readings of 169/80 and 149/80. She is asymptomatic at this time.   Patient is asking if she should increase either of her BP medications. She has been taking amlodipine and HCTZ as prescribed.   Red flags and ED precautions discussed.   Will forward to PCP for further advisement.   Talbot Grumbling, RN

## 2022-01-29 MED ORDER — AMLODIPINE BESYLATE 10 MG PO TABS: ORAL_TABLET | ORAL | 1 refills | Status: DC

## 2022-01-29 MED ORDER — SERTRALINE HCL 100 MG PO TABS: 100.0000 mg | ORAL_TABLET | Freq: Every day | ORAL | 0 refills | Status: DC

## 2022-01-29 NOTE — Telephone Encounter (Signed)
Called to discussed with patient.  Apologize for delayed response, I was working in the hospital for the last week.  She reports ongoing elevated blood pressures.  Also under increased stress and has had increased anxiety lately.  Recommend going up on amlodipine to 10 mg daily, and increasing sertraline to 100 mg daily.  She is agreeable to both of these changes.    She has an upcoming follow-up appointment with me in several weeks time, at which time we will reassess both of these.  Patient appreciative and agreeable.  Leeanne Rio, MD

## 2022-01-29 NOTE — Telephone Encounter (Signed)
Patient calls nurse line to follow up on prior message.   Patient reports that she continues to have elevated BP readings between 941-290 systolic and 47'T diastolic. Reports normal HR.   She would like to know if she should increase amlodipine or Zoloft. She does report increased amounts of stress.   She reports that she has had intermittent dull headache. She is currently asymptomatic. Red flags discussed.   Please advise.   Talbot Grumbling, RN

## 2022-02-10 DIAGNOSIS — H2513 Age-related nuclear cataract, bilateral: Secondary | ICD-10-CM | POA: Diagnosis not present

## 2022-02-10 DIAGNOSIS — H52201 Unspecified astigmatism, right eye: Secondary | ICD-10-CM | POA: Diagnosis not present

## 2022-02-15 ENCOUNTER — Encounter: Payer: Self-pay | Admitting: Family Medicine

## 2022-02-15 ENCOUNTER — Ambulatory Visit (INDEPENDENT_AMBULATORY_CARE_PROVIDER_SITE_OTHER): Payer: Medicare HMO | Admitting: Family Medicine

## 2022-02-15 VITALS — BP 135/65 | HR 63 | Ht 63.0 in | Wt 143.8 lb

## 2022-02-15 DIAGNOSIS — E785 Hyperlipidemia, unspecified: Secondary | ICD-10-CM

## 2022-02-15 DIAGNOSIS — F439 Reaction to severe stress, unspecified: Secondary | ICD-10-CM

## 2022-02-15 DIAGNOSIS — Z23 Encounter for immunization: Secondary | ICD-10-CM

## 2022-02-15 DIAGNOSIS — I10 Essential (primary) hypertension: Secondary | ICD-10-CM | POA: Diagnosis not present

## 2022-02-15 DIAGNOSIS — J301 Allergic rhinitis due to pollen: Secondary | ICD-10-CM

## 2022-02-15 DIAGNOSIS — R5383 Other fatigue: Secondary | ICD-10-CM | POA: Diagnosis not present

## 2022-02-15 DIAGNOSIS — R69 Illness, unspecified: Secondary | ICD-10-CM | POA: Diagnosis not present

## 2022-02-15 MED ORDER — FLUTICASONE PROPIONATE 50 MCG/ACT NA SUSP: 2.0000 | Freq: Every day | NASAL | 6 refills | Status: DC

## 2022-02-15 NOTE — Assessment & Plan Note (Signed)
Suspect dull headache is related to uncontrolled seasonal allergies.  No red flag symptoms.  Encouraged her to use azelastine twice per day and also add Flonase daily.  Continue cetirizine.  She will follow-up if not improving on this.

## 2022-02-15 NOTE — Progress Notes (Signed)
  Date of Visit: 02/15/2022   SUBJECTIVE:   HPI:  [name] presents today for hypertension follow up, and to discuss sinuses.  Hypertension: Currently taking amlodipine 10 mg daily and HCTZ 25 mg daily.  Tolerating these medications well.  Checks blood pressure daily and notes that her blood pressure has still frequently been in the 140s over 90s despite [place] increasing her amlodipine to 10 mg daily since her last visit.  She is reluctant to take additional medication at this time.  Mood: Taking sertraline 100 mg daily.  This was an increase from 50 mg/day, which we did over the phone a few weeks ago.  She is tolerating this higher dose well and reports her symptoms are very well controlled.  She denies any thoughts of harming herself or others.  She is happy with this dose and does not desire to increase or decrease today.    Sinuses: For the last several weeks has had dull headache around her whole head and radiating down her bilateral anterior neck.  Denies vision changes or fever.  No sinus pressure.  She thinks that this could be related to her sinuses as she does feel some drainage down the back of her throat.  When she gets a headache takes Tylenol which helps.  Denies cough.  She does take cetirizine 10 mg daily and azelastine nasal spray once per day.  Previously took [facility] but has not been on that in some time.  Does not feel congested in her nose, just the drainage down her throat.  Hyperlipidemia: Currently taking Crestor 10 mg/day.  Is due for lipids.  She is interested in additional labs as she is also felt fairly tired.  Has to take a nap every day.  Is interested in having her thyroid checked.  OBJECTIVE:   BP 135/65   Pulse 63   Ht '5\' 3"'$  (1.6 m)   Wt 143 lb 12.8 oz (65.2 kg)   SpO2 97%   BMI 25.47 kg/m  Gen: No acute distress, pleasant, cooperative HEENT: Normocephalic, atraumatic.  Oropharynx clear and moist.  No exudates seen.  Tympanic membranes clear bilaterally.  Nares  with some injection and mild edema of turbinates.  No drainage from nose.  No sinus tenderness on frontal or maxillary sinuses bilaterally.  No tenderness of neck anteriorly.  No lymphadenopathy in the anterior cervical or supraclavicular areas. Heart: Regular rate and rhythm, no murmur Lungs: Clear to auscultation bilaterally, normal effort Neuro: Alert, grossly nonfocal, speech normal Psych: Normal range of mood and affect, well groomed, normal eye contact.  Denies SI/HI  ASSESSMENT/PLAN:   Health maintenance:  -Flu shot given today   Hyperlipidemia Check lipids and complete metabolic panel today.  Also checking TSH as she has felt tired, along with CBC.  Allergic rhinitis Suspect dull headache is related to uncontrolled seasonal allergies.  No red flag symptoms.  Encouraged her to use azelastine twice per day and also add Flonase daily.  Continue cetirizine.  She will follow-up if not improving on this.  Hypertension Well-controlled on today's reading, however home numbers are elevated.  After discussion of options, elected to have her schedule with Dr. Valentina Lucks in pharmacy clinic for ambulatory blood pressure monitoring.  Stress Improved on the elevated dose of sertraline 100 mg daily.  Continue this regimen.  Follow-up in 3 months for recheck.  Yes  FOLLOW UP: Follow up in 3 mos for above issues  [place] J. Ardelia Mems, [place]

## 2022-02-15 NOTE — Patient Instructions (Signed)
It was great to see you again today!  Schedule ambulatory blood pressure monitoring with Dr. Valentina Lucks.  Use flonase daily Can do azelastine twice daily   Ordering labs today  Follow up with me in 3 months, sooner if needed  Be well, Dr. Ardelia Mems

## 2022-02-15 NOTE — Assessment & Plan Note (Signed)
Check lipids and complete metabolic panel today.  Also checking TSH as she has felt tired, along with CBC.

## 2022-02-15 NOTE — Assessment & Plan Note (Signed)
Well-controlled on today's reading, however home numbers are elevated.  After discussion of options, elected to have her schedule with Dr. Valentina Lucks in pharmacy clinic for ambulatory blood pressure monitoring.

## 2022-02-15 NOTE — Assessment & Plan Note (Signed)
Improved on the elevated dose of sertraline 100 mg daily.  Continue this regimen.  Follow-up in 3 months for recheck.  Yes

## 2022-02-16 LAB — CBC
Hematocrit: 44 % (ref 34.0–46.6)
Hemoglobin: 14.9 g/dL (ref 11.1–15.9)
MCH: 28.9 pg (ref 26.6–33.0)
MCHC: 33.9 g/dL (ref 31.5–35.7)
MCV: 85 fL (ref 79–97)
Platelets: 327 10*3/uL (ref 150–450)
RBC: 5.15 x10E6/uL (ref 3.77–5.28)
RDW: 13.1 % (ref 11.7–15.4)
WBC: 7 10*3/uL (ref 3.4–10.8)

## 2022-02-16 LAB — CMP14+EGFR
ALT: 25 IU/L (ref 0–32)
AST: 22 IU/L (ref 0–40)
Albumin/Globulin Ratio: 1.9 (ref 1.2–2.2)
Albumin: 4.6 g/dL (ref 3.9–4.9)
Alkaline Phosphatase: 83 IU/L (ref 44–121)
BUN/Creatinine Ratio: 17 (ref 12–28)
BUN: 13 mg/dL (ref 8–27)
Bilirubin Total: 0.4 mg/dL (ref 0.0–1.2)
CO2: 25 mmol/L (ref 20–29)
Calcium: 10 mg/dL (ref 8.7–10.3)
Chloride: 101 mmol/L (ref 96–106)
Creatinine, Ser: 0.76 mg/dL (ref 0.57–1.00)
Globulin, Total: 2.4 g/dL (ref 1.5–4.5)
Glucose: 92 mg/dL (ref 70–99)
Potassium: 4.6 mmol/L (ref 3.5–5.2)
Sodium: 141 mmol/L (ref 134–144)
Total Protein: 7 g/dL (ref 6.0–8.5)
eGFR: 85 mL/min/{1.73_m2} (ref >59)

## 2022-02-16 LAB — LIPID PANEL
Chol/HDL Ratio: 2.9 ratio (ref 0.0–4.4)
Cholesterol, Total: 179 mg/dL (ref 100–199)
HDL: 62 mg/dL (ref >39)
LDL Chol Calc (NIH): 95 mg/dL (ref 0–99)
Triglycerides: 125 mg/dL (ref 0–149)
VLDL Cholesterol Cal: 22 mg/dL (ref 5–40)

## 2022-02-16 LAB — TSH: TSH: 2.03 u[IU]/mL (ref 0.450–4.500)

## 2022-02-18 ENCOUNTER — Ambulatory Visit (INDEPENDENT_AMBULATORY_CARE_PROVIDER_SITE_OTHER): Payer: Medicare HMO | Admitting: Pharmacist

## 2022-02-18 DIAGNOSIS — I10 Essential (primary) hypertension: Secondary | ICD-10-CM | POA: Diagnosis not present

## 2022-02-18 NOTE — Progress Notes (Addendum)
S:     Chief Complaint  Patient presents with   Medication Management    Amb BP Monitor - Hypertension   [name] is a 71 y.o. female who presents for hypertension evaluation, education, and management.  PMH is significant for HLD, allergic rhinitis, HTN.  Patient was referred and last seen by Primary Care Provider, Dr. Ardelia Mems, on 02/15/2022.   Patient reports headaches when her blood pressure is elevated,about 1-2 times per week. Reports systolic readings consistently > 140 at home.  This has gotten better since she started taking Flonase.  Diagnosed with Hypertension in the year of 2022.    Medication compliance is reported to be good, takes all medications, rarely misses doses.  Discussed procedure for wearing the monitor and gave patient written instructions. Monitor was placed on non-dominant arm with instructions to return monitor in the morning on 9/29. (Planned to call with results in the PM of 9/29)  Current BP Medications include:  amlodipine 10 mg daily, HCTZ 25 mg daily  Antihypertensives tried in the past include: none  Dietary habits include:    O:  Review of Systems  All other systems reviewed and are negative.   Physical Exam Constitutional:      Appearance: Normal appearance. She is normal weight.  Pulmonary:     Effort: Pulmonary effort is normal.  Neurological:     Mental Status: She is alert.  Psychiatric:        Mood and Affect: Mood normal.        Behavior: Behavior normal.        Thought Content: Thought content normal.    Last 3 Office BP readings: BP Readings from Last 3 Encounters:  02/15/22 135/65  11/11/21 112/72  10/08/21 127/65    Clinical Atherosclerotic Cardiovascular Disease (ASCVD): No  The 10-year ASCVD risk score (Arnett DK, et al., 2019) is: 10.5%   Values used to calculate the score:     Age: 54 years     Sex: Female     Is Non-Hispanic African American: No     Diabetic: No     Tobacco smoker: No      Systolic Blood Pressure: 878 mmHg     Is BP treated: Yes     HDL Cholesterol: 62 mg/dL     Total Cholesterol: 179 mg/dL  Basic Metabolic Panel    Component Value Date/Time   NA 141 02/15/2022 1518   K 4.6 02/15/2022 1518   CL 101 02/15/2022 1518   CO2 25 02/15/2022 1518   GLUCOSE 92 02/15/2022 1518   GLUCOSE 115 (H) 07/06/2017 0545   BUN 13 02/15/2022 1518   CREATININE 0.76 02/15/2022 1518   CALCIUM 10.0 02/15/2022 1518   GFRNONAA 66 12/20/2019 1255   GFRAA 76 12/20/2019 1255    Renal function: Estimated Creatinine Clearance: 60.8 mL/min (by C-G formula based on SCr of 0.76 mg/dL).    A/P: History of hypertension set-up with ambulatory blodd pressure monitor.   - plans to return monitor on 9/29 in the AM - Plan to call patient with results later in the day 9/29.   Patient seen with Martina Sinner, PharmD Candidate, Jeneen Rinks,  PharmD PGY-1 Resident, and Joseph Art, PharmD, PGY2 Pharmacy Resident.  .  Patient returns monitor on 02/19/2022 I returned call to her to discuss results at ~ 2:30 PM - shared the following results and explained details of findings.   ABPM Study Data: Arm Placement left arm  Overall Mean 24hr BP:  132/67 mmHg  HR: 66  Daytime Mean BP:  141/73 mmHg  HR: 68  Nighttime Mean BP:  115/56 mmHg  HR: 63  Dipping Pattern: Yes.    Sys:   18.8   Dia: 22.2   [normal dipping ~10-20%]   For Office Goal Goal BP of <130/80:  ABPM thresholds: Overall BP < 125/75, daytime BP <130/80 mmHg, sleeptime BP <110/65 mmHg    A/P: History of hypertension found to have isolated systolic hypertension. 24-hour ambulatory blood pressure demonstrates consistently elevated systolic throughout the day.  Average awake blood pressure of 141/73 mmHg.  Nocturnal dipping pattern is normal.   Currently at Max doses of two agents:  amlodipine '10mg'$  and HCTZ '25mg'$  daily.  Following discussion with patient.  No medications made today. -Report shared with Dr. Ardelia Mems as  well.  Suggested changes to medications starting dose ARB  + '25mg'$  HCTZ combination.   *Note - appears that patient's home readings were consistent with those of the AmbBP monitor.       Results reviewed and information provided.  Patient verbalized understanding that Dr. Ardelia Mems would follow-up next week.

## 2022-02-18 NOTE — Assessment & Plan Note (Addendum)
History of hypertension found to have isolated systolic hypertension. 24-hour ambulatory blood pressure demonstrates consistently elevated systolic throughout the day.  Average awake blood pressure of 141/73 mmHg.  Nocturnal dipping pattern is normal.   Currently at Max doses of two agents:  amlodipine '10mg'$  and HCTZ '25mg'$  daily.  Following discussion with patient.  No medications made today. -Report shared with Dr. Ardelia Mems as well.  Suggested changes to medications starting dose ARB  + '25mg'$  HCTZ combination.   *Note - appears that patient's home readings were consistent with those of the AmbBP monitor.

## 2022-02-18 NOTE — Patient Instructions (Signed)
Blood Pressure Activity Diary Time Lying down/ Sleeping Walking/ Exercise Stressed/ Angry Headache/ Pain Dizzy  9 AM       10 AM       11 AM       12 PM       1 PM       2 PM       Time Lying down/ Sleeping Walking/ Exercise Stressed/ Angry Headache/ Pain Dizzy  3 PM       4 PM        5 PM       6 PM       7 PM       8 PM       Time Lying down/ Sleeping Walking/ Exercise Stressed/ Angry Headache/ Pain Dizzy  9 PM       10 PM       11 PM       12 AM       1 AM       2 AM       3 AM       Time Lying down/ Sleeping Walking/ Exercise Stressed/ Angry Headache/ Pain Dizzy  4 AM       5 AM       6 AM       7 AM       8 AM       9 AM       10 AM        Time you woke up: _________                  Time you went to sleep:__________  Come back and drop off the monitor - to have the monitor removed Call the [facility] if you have any questions before then ([phone])  Wearing the Blood Pressure Monitor The cuff will inflate every 20 minutes during the day and every 30 minutes while you sleep. Your blood pressure readings will NOT display after cuff inflation Fill out the blood pressure-activity diary during the day, especially during activities that may affect your reading -- such as exercise, stress, walking, taking your blood pressure medications  Important things to know: Avoid taking the monitor off for the next 24 hours, unless it causes you discomfort or pain. Do NOT get the monitor wet and do NOT dry to clean the monitor with any cleaning products. Do NOT put the monitor on anyone else's arm. When the cuff inflates, avoid excess movement. Let the cuffed arm hang loosely, slightly away from the body. Avoid flexing the muscles or moving the hand/fingers. When you go to sleep, make sure that the hose is not kinked. Remember to fill out the blood pressure activity diary. If you experience severe pain or unusual pain (not associated with getting your blood  pressure checked), remove the monitor.  Troubleshooting:  Code  Troubleshooting   1  Check cuff position, tighten cuff   2, 3  Remain still during reading   4, 87  Check air hose connections and make sure cuff is tight   85, 89  Check hose connections and make tubing is not crimped   86  Push START/STOP to restart reading   88, 91  Retry by pushing START/STOP   90  Replace batteries. If problem persists, remove monitor and bring back to   clinic at follow up   97, 98, 99  Service required - Remove monitor and bring back  to clinic at follow up

## 2022-02-23 ENCOUNTER — Telehealth: Payer: Self-pay

## 2022-02-23 ENCOUNTER — Telehealth: Payer: Self-pay | Admitting: Family Medicine

## 2022-02-23 DIAGNOSIS — I10 Essential (primary) hypertension: Secondary | ICD-10-CM

## 2022-02-23 MED ORDER — LOSARTAN POTASSIUM-HCTZ 50-12.5 MG PO TABS: 1.0000 | ORAL_TABLET | Freq: Every day | ORAL | 0 refills | Status: DC

## 2022-02-23 NOTE — Patient Outreach (Signed)
  Care Coordination   Initial Visit Note   02/23/2022 Name: [name] MRN: [id number] DOB: 1950-11-26  [name] is a 71 y.o. year old female who sees [name], April Limber, MD for primary care. I spoke with  [name] by phone today.  What matters to the patients health and wellness today?  Working on blood pressure control with physician    Goals Addressed             This Visit's Progress    COMPLETED: Care Coordination Activities-No follow up required       Care Coordination Interventions: Advised patient to schedule annual wellness visit          SDOH assessments and interventions completed:  No     Care Coordination Interventions Activated:  Yes  Care Coordination Interventions:  Yes, provided   Follow up plan: No further intervention required.   Encounter Outcome:  Pt. Visit Completed   Jone Baseman, RN, MSN [facility] Care Management Coordinator Direct Line [phone]

## 2022-02-23 NOTE — Telephone Encounter (Signed)
Called patient to discuss elevated blood pressures on amb BP monitoring with Dr. Valentina Lucks.  Stop HCTZ solo pill  Add losartan-HCTZ 50-12.'5mg'$  daily (no combo pill available with losart 50 and hctz 25).  Sent in to her pharmacy.  Nurse BP check and lab visit for BMET scheduled on 10/11.  Patient appreciative & agreeable.  Leeanne Rio, MD

## 2022-02-23 NOTE — Patient Instructions (Signed)
Visit Information  Thank you for taking time to visit with me today. Please don't hesitate to contact me if I can be of assistance to you.   Following are the goals we discussed today:   Goals Addressed             This Visit's Progress    COMPLETED: Care Coordination Activities-No follow up required       Care Coordination Interventions: Advised patient to schedule annual wellness visit         If you are experiencing a Mental Health or Behavioral Health Crisis or need someone to talk to, please call the Suicide and Crisis Lifeline: 988   Patient verbalizes understanding of instructions and care plan provided today and agrees to view in MyChart. Active MyChart status and patient understanding of how to access instructions and care plan via MyChart confirmed with patient.     No further follow up required: patient decline  Laisa Larrick J Rajvir Ernster, RN, MSN [facility] Care Management Coordinator Direct Line [phone]     

## 2022-03-01 DIAGNOSIS — L814 Other melanin hyperpigmentation: Secondary | ICD-10-CM | POA: Diagnosis not present

## 2022-03-01 DIAGNOSIS — D2262 Melanocytic nevi of left upper limb, including shoulder: Secondary | ICD-10-CM | POA: Diagnosis not present

## 2022-03-01 DIAGNOSIS — L821 Other seborrheic keratosis: Secondary | ICD-10-CM | POA: Diagnosis not present

## 2022-03-01 DIAGNOSIS — B078 Other viral warts: Secondary | ICD-10-CM | POA: Diagnosis not present

## 2022-03-01 DIAGNOSIS — L570 Actinic keratosis: Secondary | ICD-10-CM | POA: Diagnosis not present

## 2022-03-01 DIAGNOSIS — D485 Neoplasm of uncertain behavior of skin: Secondary | ICD-10-CM | POA: Diagnosis not present

## 2022-03-01 DIAGNOSIS — D1801 Hemangioma of skin and subcutaneous tissue: Secondary | ICD-10-CM | POA: Diagnosis not present

## 2022-03-01 DIAGNOSIS — L578 Other skin changes due to chronic exposure to nonionizing radiation: Secondary | ICD-10-CM | POA: Diagnosis not present

## 2022-03-03 ENCOUNTER — Other Ambulatory Visit: Payer: Medicare HMO

## 2022-03-03 ENCOUNTER — Ambulatory Visit (INDEPENDENT_AMBULATORY_CARE_PROVIDER_SITE_OTHER): Payer: Medicare HMO

## 2022-03-03 VITALS — BP 138/78 | HR 58

## 2022-03-03 DIAGNOSIS — Z0130 Encounter for examination of blood pressure without abnormal findings: Secondary | ICD-10-CM

## 2022-03-03 DIAGNOSIS — I10 Essential (primary) hypertension: Secondary | ICD-10-CM

## 2022-03-03 NOTE — Progress Notes (Signed)
Patient presents to nurse clinic for BP check. Patient has been taking BP medications as prescribed.   BP today was 138/78, HR: 58 and SpO2 98%  Patient also provided with BP log over the last week. See below.   02/24/22: 136/73, HR 71 02/25/22: 136/68, HR: 67 02/26/22: 128/70, HR: 63 02/27/22: 135/68, HR: 67 02/28/22: 143/74, 131/68,136/67, HR: 65 03/01/22: 135/68, 129/59, HR: 64 03/02/22: 153/80, 147/71, 127/61 03/03/22: 159/77, 142/70, 135/68.  Patient is asymptomatic.   Precepted with Dr. Thompson Grayer. Recommended that patient continue current medication regimen and to forward to PCP for further advisement.   Advised patient to continue to measure BP once daily (preferably in the AM) and to keep log. Assisted patient to lab for BMP.   Forwarding to Dr. Ardelia Mems.   Talbot Grumbling, RN

## 2022-03-04 LAB — BASIC METABOLIC PANEL
BUN/Creatinine Ratio: 18 (ref 12–28)
BUN: 15 mg/dL (ref 8–27)
CO2: 25 mmol/L (ref 20–29)
Calcium: 9.7 mg/dL (ref 8.7–10.3)
Chloride: 101 mmol/L (ref 96–106)
Creatinine, Ser: 0.83 mg/dL (ref 0.57–1.00)
Glucose: 87 mg/dL (ref 70–99)
Potassium: 4.6 mmol/L (ref 3.5–5.2)
Sodium: 138 mmol/L (ref 134–144)
eGFR: 77 mL/min/{1.73_m2} (ref >59)

## 2022-03-17 ENCOUNTER — Other Ambulatory Visit: Payer: Self-pay | Admitting: Family Medicine

## 2022-03-19 ENCOUNTER — Other Ambulatory Visit: Payer: Self-pay

## 2022-03-19 MED ORDER — AZELASTINE HCL 0.15 % NA SOLN: 2.0000 | Freq: Two times a day (BID) | NASAL | 1 refills | Status: AC

## 2022-03-20 ENCOUNTER — Other Ambulatory Visit: Payer: Self-pay | Admitting: Family Medicine

## 2022-04-19 ENCOUNTER — Encounter: Payer: Self-pay | Admitting: Family Medicine

## 2022-04-19 ENCOUNTER — Ambulatory Visit (INDEPENDENT_AMBULATORY_CARE_PROVIDER_SITE_OTHER): Payer: Medicare HMO | Admitting: Family Medicine

## 2022-04-19 VITALS — BP 122/82 | HR 67 | Ht 63.0 in | Wt 150.0 lb

## 2022-04-19 DIAGNOSIS — I10 Essential (primary) hypertension: Secondary | ICD-10-CM

## 2022-04-19 DIAGNOSIS — J301 Allergic rhinitis due to pollen: Secondary | ICD-10-CM

## 2022-04-19 DIAGNOSIS — Z23 Encounter for immunization: Secondary | ICD-10-CM | POA: Diagnosis not present

## 2022-04-19 DIAGNOSIS — E785 Hyperlipidemia, unspecified: Secondary | ICD-10-CM

## 2022-04-19 DIAGNOSIS — F439 Reaction to severe stress, unspecified: Secondary | ICD-10-CM

## 2022-04-19 DIAGNOSIS — R69 Illness, unspecified: Secondary | ICD-10-CM | POA: Diagnosis not present

## 2022-04-19 NOTE — Assessment & Plan Note (Signed)
Currently well controlled on losartan-HCTZ 50-12.5 mg daily and BMP WNL on 10/11.  Continue current regimen.

## 2022-04-19 NOTE — Progress Notes (Unsigned)
SUBJECTIVE:   CHIEF COMPLAINT / HPI:  [name] is a 71 y.o. female with a past medical history of HTN, osteopenia, GERD, allergic rhinitis, HLD, and acutely increased stress presenting to the clinic for routine checkup.  Hyperlipidemia Lipids, CMP, and TSH within normal limits on 02/16/22.   Allergic rhinitis Patient has been using azelastine nasal spray BID, has to buy brand name now because [facility] is unable to get generic.  More expensive, but worth it.  No noticeable symptoms present now, feels significant relief with azelastine nasal spray.  Denies headache, fevers, epistaxis, and cough.  Continuing fluticasone BID and cetrizine 10 mg daily.   Hypertension Well-controlled on recheck today reading, home numbers are almost entirely below goal of 140/90.  Per Dr. Graylin Shiver note on 10/03, patient stopped HCTZ solo pill and switched to losartan-HCTZ 50-12.'5mg'$  daily (no combo pill available with losart 50 and HCTZ 25).  Stress Patient continues to take sertaline 100 mg daily and does not report any adverse effects.  Believes her dreams may be more vivid or she is remembering them better.  Feeling less tired now than at last visit.  Notes that stress level is still very high due to factors such as having to deal with selling her late sister-in-law's estate.  Able to cope day to day well, however.  Does not think needs to increase sertaline.  Would actually like to try to decrease, but not right now as she wants to "get past" a few stressors in her life first.  Appetite has increased.  Sleep is good.  Mood is okay overall.  GERD Minimal symptoms on pantoprazole 40 mg daily.  Only occasional morning reflux is felt.   PERTINENT  PMH / PSH: Lives at home with husband.  Not currently working.  Patient Care Team: Leeanne Rio, MD as PCP - General (Family Medicine)  OBJECTIVE:   BP 122/82   Pulse 67   Ht '5\' 3"'$  (1.6 m)   Wt 150 lb (68 kg)   SpO2 98%   BMI 26.57 kg/m    General: Age-appropriate, resting comfortably in chair, NAD, WNWD, alert and at baseline. Cardiovascular: Regular rate and rhythm. Normal S1/S2. No murmurs, rubs, or gallops appreciated. 2+ radial pulses. Pulmonary: Clear bilaterally to ascultation. No increased WOB, no accessory muscle usage. No wheezes, rales, or crackles. Extremities: No peripheral edema bilaterally.  {Show previous vital signs (optional):23777}  {Labs  Heme  Chem  Endocrine  Serology  Results Review (optional):23779}  ASSESSMENT/PLAN:   Allergic rhinitis Continue azelastine spray BID, fluticasone spray BID, and cetrizine 10 mg daily.  Patient's symptoms are stable on current regimen and she is satisfied.  Stress Patient is currently stable on 100 mg sertraline daily.  She is interested in decreasing this dose in the future, but believes it makes sense to continue on current dose until some of her stressors are resolved.  Affirmed patient's right to choose whether and when to taper sertraline.  Patient's sleep, mood, appetite, energy, and concentration are improved.  Symptoms and time course generally fit with adjustment disorder criteria.  Hyperlipidemia Lipids, CMP, and TSH WNL 9/26.  Continue rosuvastatin 10 mg daily.  No adverse effects reported.  Hypertension Currently well controlled on losartan-HCTZ 50-12.5 mg daily and BMP WNL on 10/11.  Continue current regimen.  Health maintenance - Immunizations: Pfizer COVID-19 booster given today - Colonoscopy: Last done 05/12/2018, next due in 2029 - Mammogram: Last done 04/30/2021, next due 2024  Follow-up Follow-up in 6 months or  sooner if needed.  Dimitry Mining engineer, [place]

## 2022-04-19 NOTE — Patient Instructions (Addendum)
Thank you for coming in today! It was great to see you.  Stay on current medications COVID vaccine today Follow up in 6 months, sooner if needed  Be well, Dr. Ardelia Mems

## 2022-04-19 NOTE — Assessment & Plan Note (Signed)
Patient is currently stable on 100 mg sertraline daily.  She is interested in decreasing this dose in the future, but believes it makes sense to continue on current dose until some of her stressors are resolved.  Affirmed patient's right to choose whether and when to taper sertraline.  Patient's sleep, mood, appetite, energy, and concentration are improved.

## 2022-04-19 NOTE — Assessment & Plan Note (Signed)
Lipids, CMP, and TSH WNL 9/26.  Continue rosuvastatin 10 mg daily.  No adverse effects reported.

## 2022-04-19 NOTE — Assessment & Plan Note (Signed)
Continue azelastine spray BID, fluticasone spray BID, and cetrizine 10 mg daily.  Patient's symptoms are stable on current regimen and she is satisfied.

## 2022-05-03 DIAGNOSIS — Z1231 Encounter for screening mammogram for malignant neoplasm of breast: Secondary | ICD-10-CM | POA: Diagnosis not present

## 2022-05-11 ENCOUNTER — Emergency Department (HOSPITAL_BASED_OUTPATIENT_CLINIC_OR_DEPARTMENT_OTHER): Payer: Medicare HMO

## 2022-05-11 ENCOUNTER — Encounter (HOSPITAL_BASED_OUTPATIENT_CLINIC_OR_DEPARTMENT_OTHER): Payer: Self-pay

## 2022-05-11 ENCOUNTER — Other Ambulatory Visit: Payer: Self-pay

## 2022-05-11 ENCOUNTER — Emergency Department (HOSPITAL_BASED_OUTPATIENT_CLINIC_OR_DEPARTMENT_OTHER)
Admission: EM | Admit: 2022-05-11 | Discharge: 2022-05-11 | Disposition: A | Discharge status: Home / Self Care | Payer: Medicare HMO | Attending: Emergency Medicine | Admitting: Emergency Medicine

## 2022-05-11 DIAGNOSIS — N200 Calculus of kidney: Secondary | ICD-10-CM | POA: Insufficient documentation

## 2022-05-11 DIAGNOSIS — K760 Fatty (change of) liver, not elsewhere classified: Secondary | ICD-10-CM | POA: Diagnosis not present

## 2022-05-11 DIAGNOSIS — R319 Hematuria, unspecified: Secondary | ICD-10-CM

## 2022-05-11 DIAGNOSIS — N23 Unspecified renal colic: Secondary | ICD-10-CM | POA: Diagnosis not present

## 2022-05-11 DIAGNOSIS — Z79899 Other long term (current) drug therapy: Secondary | ICD-10-CM | POA: Insufficient documentation

## 2022-05-11 DIAGNOSIS — R1031 Right lower quadrant pain: Secondary | ICD-10-CM | POA: Diagnosis not present

## 2022-05-11 LAB — URINALYSIS, ROUTINE W REFLEX MICROSCOPIC
Bilirubin Urine: NEGATIVE
Glucose, UA: NEGATIVE mg/dL
Ketones, ur: NEGATIVE mg/dL
Nitrite: NEGATIVE
Protein, ur: 30 mg/dL — AB
RBC / HPF: >50 RBC/hpf — ABNORMAL HIGH (ref 0–5)
Specific Gravity, Urine: 1.026 (ref 1.005–1.030)
pH: 5.5 (ref 5.0–8.0)

## 2022-05-11 LAB — COMPREHENSIVE METABOLIC PANEL
ALT: 39 U/L (ref 0–44)
AST: 22 U/L (ref 15–41)
Albumin: 4.6 g/dL (ref 3.5–5.0)
Alkaline Phosphatase: 66 U/L (ref 38–126)
Anion gap: 10 (ref 5–15)
BUN: 13 mg/dL (ref 8–23)
CO2: 25 mmol/L (ref 22–32)
Calcium: 9.4 mg/dL (ref 8.9–10.3)
Chloride: 106 mmol/L (ref 98–111)
Creatinine, Ser: 0.89 mg/dL (ref 0.44–1.00)
GFR, Estimated: >60 mL/min (ref >60)
Glucose, Bld: 135 mg/dL — ABNORMAL HIGH (ref 70–99)
Potassium: 3.6 mmol/L (ref 3.5–5.1)
Sodium: 141 mmol/L (ref 135–145)
Total Bilirubin: 0.3 mg/dL (ref 0.3–1.2)
Total Protein: 7.4 g/dL (ref 6.5–8.1)

## 2022-05-11 LAB — CBC
HCT: 42.1 % (ref 36.0–46.0)
Hemoglobin: 14.2 g/dL (ref 12.0–15.0)
MCH: 28.6 pg (ref 26.0–34.0)
MCHC: 33.7 g/dL (ref 30.0–36.0)
MCV: 84.7 fL (ref 80.0–100.0)
Platelets: 320 10*3/uL (ref 150–400)
RBC: 4.97 MIL/uL (ref 3.87–5.11)
RDW: 13.3 % (ref 11.5–15.5)
WBC: 6.5 10*3/uL (ref 4.0–10.5)
nRBC: 0 % (ref 0.0–0.2)

## 2022-05-11 LAB — LIPASE, BLOOD: Lipase: 33 U/L (ref 11–51)

## 2022-05-11 MED ORDER — KETOROLAC TROMETHAMINE 15 MG/ML IJ SOLN
15.0000 mg | Freq: Once | INTRAMUSCULAR | Status: AC
  Administered 2022-05-11: 15 mg via INTRAVENOUS
  Filled 2022-05-11: qty 1

## 2022-05-11 NOTE — ED Triage Notes (Signed)
Right side and lower abdominal pain since last night. Denies urinary symptoms or bleeding.  Has had nausea and vomiting.

## 2022-05-11 NOTE — Discharge Instructions (Signed)
Your symptoms today are consistent with a kidney stone.  A kidney stone was seen in your bladder and has a likely recently been passed.  There is another kidney stone in your kidney that could be passed at any time but does not, likely current causing you any current symptoms. Likely secondary to this there is blood in your urine.  However this does need to be followed until it is cleared up. You were given a referral to a urologist please call and make an appointment for follow-up with urologist and your primary care doctor Please drink plenty of fluids and use ibuprofen as needed for pain.

## 2022-05-11 NOTE — ED Provider Notes (Signed)
[facility] EMERGENCY DEPT Provider Note   CSN: [id number] Arrival date & time: 05/11/22  0524     History  Chief Complaint  Patient presents with   Abdominal Pain    [name] is a 71 y.o. female.  HPI 71 year old female presents today complaining of some suprapubic to right lower quadrant pain.  States she began having some suprapubic pain last night followed by some pain that radiated to her right lower quadrant.  Pain was severe.  She had some associated nausea and vomiting with pain.  She has not had fever, chills, noted bloody urine, or urinary frequency.  She has previously had a cholecystectomy.  Her daughter thinks she may have had a kidney stone in the past but this is unclear     Home Medications Prior to Admission medications   Medication Sig Start Date End Date Taking? Authorizing Provider  amLODipine (NORVASC) 10 MG tablet TAKE 1 TABLET BY MOUTH DAILY. 01/29/22   Leeanne Rio, MD  Azelastine HCl 0.15 % SOLN Place 2 sprays into both nostrils 2 (two) times daily. 03/19/22   Leeanne Rio, MD  calcium carbonate (CALCIUM 600) 600 MG TABS tablet Take 1 tablet (600 mg total) by mouth 2 (two) times daily with a meal. Patient taking differently: Take 600 mg by mouth daily with breakfast. 09/17/16   Lupita Dawn, MD  cetirizine (ZYRTEC) 10 MG tablet Take 10 mg by mouth daily.    [provider]  Cholecalciferol (VITAMIN D3) 2000 units TABS Take 1 tablet by mouth daily. 09/17/16   Lupita Dawn, MD  famotidine (PEPCID) 20 MG tablet TAKE 1 TABLET BY MOUTH TWICE A DAY 07/22/21   Dickie La, MD  fluticasone [facility]) 50 MCG/ACT nasal spray Place 2 sprays into both nostrils daily. 02/15/22   Leeanne Rio, MD  losartan-hydrochlorothiazide (HYZAAR) 50-12.5 MG tablet Take 1 tablet by mouth daily. 02/23/22   Leeanne Rio, MD  pantoprazole ([place]) 40 MG tablet TAKE 1 TABLET BY MOUTH EVERY DAY 03/23/22   Leeanne Rio,  MD  rosuvastatin (CRESTOR) 10 MG tablet TAKE 1 TABLET BY MOUTH EVERY DAY. 12/18/21   Leeanne Rio, MD  sertraline (ZOLOFT) 100 MG tablet TAKE 1 TABLET BY MOUTH EVERY DAY 03/19/22   Leeanne Rio, MD      Allergies    Patient has no known allergies.    Review of Systems   Review of Systems  Physical Exam Updated Vital Signs BP 122/66   Pulse 61   Temp 98.1 F (36.7 C) (Oral)   Resp 16   Ht 1.6 m ('5\' 3"'$ )   Wt 65.8 kg   SpO2 95%   BMI 25.69 kg/m  Physical Exam Vitals reviewed.  Constitutional:      Appearance: She is well-developed.  HENT:     Head: Normocephalic.     Mouth/Throat:     Mouth: Mucous membranes are moist.  Eyes:     Extraocular Movements: Extraocular movements intact.  Cardiovascular:     Rate and Rhythm: Normal rate and regular rhythm.     Heart sounds: Normal heart sounds.  Pulmonary:     Effort: Pulmonary effort is normal.  Abdominal:     General: Abdomen is flat. Bowel sounds are normal.     Palpations: Abdomen is soft.     Tenderness: There is abdominal tenderness.     Comments: Very mild diffuse tenderness in the right lower quadrant to suprapubic area  Skin:  General: Skin is warm.     Capillary Refill: Capillary refill takes less than 2 seconds.  Neurological:     Mental Status: She is alert.  Psychiatric:        Mood and Affect: Mood normal.     ED Results / Procedures / Treatments   Labs (all labs ordered are listed, but only abnormal results are displayed) Labs Reviewed  COMPREHENSIVE METABOLIC PANEL - Abnormal; Notable for the following components:      Result Value   Glucose, Bld 135 (*)    All other components within normal limits  URINALYSIS, ROUTINE W REFLEX MICROSCOPIC - Abnormal; Notable for the following components:   Hgb urine dipstick MODERATE (*)    Protein, ur 30 (*)    Leukocytes,Ua SMALL (*)    RBC / HPF >50 (*)    Bacteria, UA FEW (*)    All other components within normal limits  LIPASE, BLOOD   CBC    EKG None  Radiology CT Renal Stone Study  Result Date: 05/11/2022 CLINICAL DATA:  RIGHT-sided flank and lower abdominal pain since last night, nausea, vomiting EXAM: CT ABDOMEN AND PELVIS WITHOUT CONTRAST TECHNIQUE: Multidetector CT imaging of the abdomen and pelvis was performed following the standard protocol without IV contrast. RADIATION DOSE REDUCTION: This exam was performed according to the departmental dose-optimization program which includes automated exposure control, adjustment of the mA and/or kV according to patient size and/or use of iterative reconstruction technique. COMPARISON:  None Available. FINDINGS: Lower chest: Lung bases clear Hepatobiliary: Gallbladder surgically absent. Fatty infiltration of liver. No focal hepatic abnormalities. Pancreas: Normal appearance Spleen: Normal appearance Adrenals/Urinary Tract: Adrenal glands and LEFT kidney normal appearance. Tiny nonobstructing calculus inferior pole RIGHT kidney. No renal masses, hydronephrosis, or hydroureter. No ureteral calcifications. Tiny calculus dependently within urinary bladder. Stomach/Bowel: Normal appendix. Colon under distended, unable to exclude wall thickening in this setting especially at rectum. Stomach and bowel loops otherwise unremarkable. Vascular/Lymphatic: Aorta normal caliber.  No adenopathy. Reproductive: Uterus surgically absent with nonvisualization of ovaries. Other: No free air or free fluid. No hernia or inflammatory process. Musculoskeletal: Osseous demineralization. IMPRESSION: Tiny nonobstructing calculus inferior pole RIGHT kidney. Tiny calculus dependently within urinary bladder, question passed calculus, without hydronephrosis or hydroureter. Fatty infiltration of liver. No other intra-abdominal or intrapelvic abnormalities. Electronically Signed   By: Lavonia Dana M.D.   On: 05/11/2022 08:20    Procedures Procedures    Medications Ordered in ED Medications  ketorolac (TORADOL) 15  MG/ML injection 15 mg (15 mg Intravenous Given 05/11/22 0800)    ED Course/ Medical Decision Making/ A&P Clinical Course as of 05/11/22 0931  Tue May 11, 2022  0836 CT abdomen pelvis stone study reviewed interpreted significant for stone in right kidney and then a lower calcification which radiologist identifies as tiny calculus within urinary bladder likely recently passed no evidence of hydronephrosis or hydroureter [DR]    Clinical Course User Index [DR] Pattricia Boss, MD                           Medical Decision Making Patient with suprapubic to right lower quadrant pain that began last night. Differential diagnosis includes but is not limited to appendicitis, urinary tract infection, renal colic, ovarian or other pelvic etiologies Patient has normal labs with the exception of greater than 50 red blood cells in her urine.  Given the type of pain that she is having and the hematuria, CT for kidney  stone obtained. CT was reviewed and there is a intra kidney stone that is unlikely to be causing her problems however, there is a dependent stone in the bladder which is probably recently passed.  This is consistent with the patient's decreased pain. Patient advised of return precautions and need for follow-up and voices understanding  Amount and/or Complexity of Data Reviewed Labs: ordered. Decision-making details documented in ED Course. Radiology: ordered and independent interpretation performed. Decision-making details documented in ED Course.  Risk Prescription drug management.           Final Clinical Impression(s) / ED Diagnoses Final diagnoses:  Renal colic  Hematuria, unspecified type    Rx / DC Orders ED Discharge Orders     None         Pattricia Boss, MD 05/11/22 [phone]

## 2022-05-13 ENCOUNTER — Other Ambulatory Visit: Payer: Self-pay | Admitting: Family Medicine

## 2022-06-02 ENCOUNTER — Telehealth: Payer: Self-pay | Admitting: Family Medicine

## 2022-06-02 NOTE — Telephone Encounter (Signed)
Patient called stating that she would like to try to come off Zoloft and wants to talk to Dr. Ardelia Mems about it first. Wants to do a virtual visit if possible

## 2022-06-02 NOTE — Telephone Encounter (Signed)
I'm fine with a virtual visit for this if you want to schedule her wherever I have an opening. If she wants to be seen sooner than I have an opening let me know.  Thanks! Leeanne Rio, MD

## 2022-06-03 ENCOUNTER — Telehealth (INDEPENDENT_AMBULATORY_CARE_PROVIDER_SITE_OTHER): Payer: Medicare HMO | Admitting: Family Medicine

## 2022-06-03 DIAGNOSIS — F439 Reaction to severe stress, unspecified: Secondary | ICD-10-CM

## 2022-06-03 DIAGNOSIS — R69 Illness, unspecified: Secondary | ICD-10-CM | POA: Diagnosis not present

## 2022-06-03 DIAGNOSIS — N200 Calculus of kidney: Secondary | ICD-10-CM | POA: Diagnosis not present

## 2022-06-03 NOTE — Progress Notes (Signed)
[facility] Visit  Patient consented to have virtual visit and was identified by name and date of birth. Method of visit: Video  Encounter participants: Patient: [name] - located at home Provider: Chrisandra Netters - located at office Others (if applicable): n/a  Chief Complaint: discuss discontinuing zoloft  HPI:  Stress - currently taking sertraline '100mg'$  daily. Tolerating this well.  Her stress has been decreased a lot lately and she would like to discuss decreasing the dose of her medication down to 50 mg.  Denies any thoughts of hurting herself or others  ROS: per HPI  Pertinent PMHx: History of hypertension, hyperlipidemia GERD  Exam:  Gen: No acute distress, pleasant, cooperative Respiratory: Speaks in full sentences without distress, normal respiratory effort Psych: normal range of affect, well groomed, speech normal in rate and volume, normal eye contact   Assessment/Plan:  Stress Much improved.  Desires to decrease dose to 50 mg.  She still has 50 mg pills at home.  Discussed doing 1.5 pills of the 50 mg pills (for a total 75 mg daily) for 1 to 2 weeks.  After that she can go down to 50 mg daily.  She is agreeable to this plan.  She will let me know if she has any issues with it.   Time spent during visit with patient: 5 minutes

## 2022-06-05 NOTE — Assessment & Plan Note (Signed)
Much improved.  Desires to decrease dose to 50 mg.  She still has 50 mg pills at home.  Discussed doing 1.5 pills of the 50 mg pills (for a total 75 mg daily) for 1 to 2 weeks.  After that she can go down to 50 mg daily.  She is agreeable to this plan.  She will let me know if she has any issues with it.

## 2022-06-14 ENCOUNTER — Other Ambulatory Visit: Payer: Self-pay | Admitting: Family Medicine

## 2022-06-15 DIAGNOSIS — R69 Illness, unspecified: Secondary | ICD-10-CM | POA: Diagnosis not present

## 2022-07-15 ENCOUNTER — Encounter: Payer: Self-pay | Admitting: Family Medicine

## 2022-07-15 ENCOUNTER — Ambulatory Visit (INDEPENDENT_AMBULATORY_CARE_PROVIDER_SITE_OTHER): Payer: Medicare HMO | Admitting: Family Medicine

## 2022-07-15 VITALS — BP 132/80 | HR 83 | Temp 99.4°F | Ht 63.0 in | Wt 146.0 lb

## 2022-07-15 DIAGNOSIS — K219 Gastro-esophageal reflux disease without esophagitis: Secondary | ICD-10-CM | POA: Diagnosis not present

## 2022-07-15 DIAGNOSIS — R509 Fever, unspecified: Secondary | ICD-10-CM

## 2022-07-15 DIAGNOSIS — U071 COVID-19: Secondary | ICD-10-CM | POA: Diagnosis not present

## 2022-07-15 LAB — POC SOFIA 2 FLU + SARS ANTIGEN FIA
Influenza A, POC: NEGATIVE
Influenza B, POC: NEGATIVE
SARS Coronavirus 2 Ag: POSITIVE — AB

## 2022-07-15 MED ORDER — PANTOPRAZOLE SODIUM 40 MG PO TBEC: 40.0000 mg | DELAYED_RELEASE_TABLET | Freq: Two times a day (BID) | ORAL | 0 refills | Status: DC

## 2022-07-15 NOTE — Patient Instructions (Addendum)
It was great to see you!  For your reflux symptoms, we can double the dose of your Protonix so that you take it in the morning (30 minutes before breakfast) AND in the evening (30 minutes before dinner). It's also a good idea to get back in with your GI doctor so please reach out to their office (if you need a new referral let us know). [facility], Dr Ardis Hughs: [phone]  For your sinus congestion and pressure, you tested positive for COVID today. We will treat this like any other common cold. You can take Tylenol as needed for fever or discomfort. You can use honey for cough/sore throat. Be sure to stay hydrated and get plenty of rest. Wear a mask and wash hands often. Stay isolated until you have been fever- free for 24 hours.  Reach out if you develop difficulty breathing or any other concerns.  Take care, Dr Rock Nephew

## 2022-07-15 NOTE — Assessment & Plan Note (Signed)
Symptoms not well-controlled over past few weeks. No alarm features. Will increase PPI to BID and continue Pepcid. Advised she follow up with her GI provider as well since she hasn't seen them in several years.

## 2022-07-15 NOTE — Progress Notes (Signed)
    SUBJECTIVE:   CHIEF COMPLAINT / HPI:   GERD 3-4 weeks ago felt like her reflux was acting up, had to get up at night to spit up Getting progressively worse over that time Sometimes pain in her esophagus with it Last night woke up with vomiting (~3am) As soon as she eats feels like she has regurgitation in her throat No alarm features: no anorexia/weight loss or dysphagia Had EGD in 2020 Taking protonix 59m daily, pepcid 273mBID Takes her PPI in the evening-- someone told her to do this since her symptoms are most prominent at night  Sinus Pressure/Fever -sinus pressure started at least 1 week ago -trying neti-pot at home -also uses azelastine and flonase -no relief with these -was having a lot of nasal drainage but pharmacist told her to switch from zyrtec to allegra and now feels like this is drying her up -nose feels raw, ears hurt -developed fever 100.6 at home today -also has cough which she is certain is due to her reflux -no dyspnea -husband congested as well but he doesn't have fever   PERTINENT  PMH / PSH: HTN, HLD, allergic rhinitis, GERD  OBJECTIVE:   BP 132/80   Pulse 83   Temp 99.4 F (37.4 C)   Ht 5' 3"$  (1.6 m)   Wt 146 lb (66.2 kg)   SpO2 97%   BMI 25.86 kg/m   Gen: alert, well-appearing, NAD Head: [place]/AT, no significant sinus tenderness Eyes: normal sclera and conjunctiva, PERRL Ears: external ears, canals, and TMs normal bilaterally Nose: nares patent, mild turbinate hypertrophy on right, audible nasal congestion Throat: oropharynx unremarkable without tonsillar edema, erythema or exudate Neck: no cervical or supraclavicular lymphadenopathy CV: RRR, normal S1/S2 without m/r/g Resp: normal effort, lungs CTAB  GI: abd soft, NTND Skin: no rashes on exposed skin Neuro: grossly intact   ASSESSMENT/PLAN:   GERD (gastroesophageal reflux disease) Symptoms not well-controlled over past few weeks. No alarm features. Will increase PPI to BID and  continue Pepcid. Advised she follow up with her GI provider as well since she hasn't seen them in several years.   COVID Tested positive for COVID in the office today. Categorized as mild illnes severity. Outside the window for anti-viral therapy. Advised supportive care and discussed return precautions.   AsAlcus DadMD [place]

## 2022-07-16 ENCOUNTER — Encounter: Payer: Self-pay | Admitting: Physician Assistant

## 2022-07-16 ENCOUNTER — Other Ambulatory Visit: Payer: Self-pay | Admitting: Family Medicine

## 2022-07-19 ENCOUNTER — Ambulatory Visit: Payer: Medicare HMO | Admitting: Family Medicine

## 2022-08-04 ENCOUNTER — Encounter: Payer: Self-pay | Admitting: Nurse Practitioner

## 2022-08-04 ENCOUNTER — Ambulatory Visit: Payer: Medicare HMO | Admitting: Nurse Practitioner

## 2022-08-04 VITALS — BP 124/68 | HR 66 | Ht 63.0 in | Wt 145.0 lb

## 2022-08-04 DIAGNOSIS — R1013 Epigastric pain: Secondary | ICD-10-CM | POA: Diagnosis not present

## 2022-08-04 DIAGNOSIS — K219 Gastro-esophageal reflux disease without esophagitis: Secondary | ICD-10-CM

## 2022-08-04 NOTE — Progress Notes (Signed)
Assessment   71 yo female here with the following   # Chronic GERD / hx of small hiatal hernia   Having breakthrough reflux symptoms and epigastric discomfort over the last 8 weeks. Only about 50% improved with major dietary changes, anti-reflux measures and an increase in pantoprazole to BID. Also still takes Pepcid at night.    # History of colon polyps.  Small sessile serrated adenoma removed December 2019. Originally was to have a 5 year recall but this can be extended to 7 years based on current polyp surveillance guidelines .   # Fatty liver disease noted on CT scan.  Normal LFTs  Plan   - Schedule for EGD for evaluation of breakthrough GERD symptoms / hiatal hernia.  The risks and benefits of EGD with possible biopsies were discussed with the patient who agrees to proceed.  -If EGD unrevealing and GERD symptoms persist then consider 24hr ph / impedence study. If symptoms correlated with reflux might she be a TIF candidate? - Continue anti-reflux measures  - Will defer timing of surveillance colonoscopy to Dr. Candis Schatz who will assume patient's care in Dr. Eugenia Pancoast absence. Patient is comfortable with the 7 year follow up. She has no alarm symptoms and no FMH of colon cancer.   History of present illness   Chief Complaint: severe acid reflux  [name] is a 72 y.o. year old female known to Dr Ardis Hughs with a past medical history of GERD, kidney stones, fatty liver colon polyps.  See PMH / [place] for additional history.   GERD symptoms were well controlled on daily PPI and Pepcid at night until 8 weeks ago. Now having regurgitation, epigastric discomfort and vomiting. Discomfort doesn't radiate through to her back.. She feels food in esophagus after eating but doesn't regurgitate all the way into mouth. She has made major dietary changes omitting garlic, fatty and spicy foods. Also stopped caffeinated beverages.  PCP increased PPI to BID and she has become more diligent  with anti-reflux measures.  Overall she is only about 50 % better with above interventions. No dysphagia. Weight is stable.  Hgb normal on December 2024 labs  No recent medication changes to contribute to GERD symptoms. No use of energy drinks. No supplements. Drinks only decaf coffee.    Previous GI history:  Dec 2019 colonoscopy  ( Dr. Collene Mares) -small sessile cecal polyp -path - sessile serrated adenoma.   EGD Nov 2020  Small hiatal hernia and gastritis   Previous Labs / Imaging::    Latest Ref Rng & Units 05/11/2022    5:51 AM 02/15/2022    3:18 PM 10/14/2017    9:31 AM  CBC  WBC 4.0 - 10.5 K/uL 6.5  7.0  6.9   Hemoglobin 12.0 - 15.0 g/dL 14.2  14.9  15.1   Hematocrit 36.0 - 46.0 % 42.1  44.0  44.9   Platelets 150 - 400 K/uL 320  327  314     Lab Results  Component Value Date   LIPASE 33 05/11/2022      Latest Ref Rng & Units 05/11/2022    5:51 AM 03/03/2022   11:28 AM 02/15/2022    3:18 PM  CMP  Glucose 70 - 99 mg/dL 135  87  92   BUN 8 - 23 mg/dL '13  15  13   '$ Creatinine 0.44 - 1.00 mg/dL 0.89  0.83  0.76   Sodium 135 - 145 mmol/L 141  138  141  Potassium 3.5 - 5.1 mmol/L 3.6  4.6  4.6   Chloride 98 - 111 mmol/L 106  101  101   CO2 22 - 32 mmol/L '25  25  25   '$ Calcium 8.9 - 10.3 mg/dL 9.4  9.7  10.0   Total Protein 6.5 - 8.1 g/dL 7.4   7.0   Total Bilirubin 0.3 - 1.2 mg/dL 0.3   0.4   Alkaline Phos 38 - 126 U/L 66   83   AST 15 - 41 U/L 22   22   ALT 0 - 44 U/L 39   25     Imaging:  CT Renal Stone Study CLINICAL DATA:  RIGHT-sided flank and lower abdominal pain since last night, nausea, vomiting  EXAM: CT ABDOMEN AND PELVIS WITHOUT CONTRAST  TECHNIQUE: Multidetector CT imaging of the abdomen and pelvis was performed following the standard protocol without IV contrast.  RADIATION DOSE REDUCTION: This exam was performed according to the departmental dose-optimization program which includes automated exposure control, adjustment of the mA and/or kV  according to patient size and/or use of iterative reconstruction technique.  COMPARISON:  None Available.  FINDINGS: Lower chest: Lung bases clear  Hepatobiliary: Gallbladder surgically absent. Fatty infiltration of liver. No focal hepatic abnormalities.  Pancreas: Normal appearance  Spleen: Normal appearance  Adrenals/Urinary Tract: Adrenal glands and LEFT kidney normal appearance. Tiny nonobstructing calculus inferior pole RIGHT kidney. No renal masses, hydronephrosis, or hydroureter. No ureteral calcifications. Tiny calculus dependently within urinary bladder.  Stomach/Bowel: Normal appendix. Colon under distended, unable to exclude wall thickening in this setting especially at rectum. Stomach and bowel loops otherwise unremarkable.  Vascular/Lymphatic: Aorta normal caliber.  No adenopathy.  Reproductive: Uterus surgically absent with nonvisualization of ovaries.  Other: No free air or free fluid. No hernia or inflammatory process.  Musculoskeletal: Osseous demineralization.  IMPRESSION: Tiny nonobstructing calculus inferior pole RIGHT kidney.  Tiny calculus dependently within urinary bladder, question passed calculus, without hydronephrosis or hydroureter.  Fatty infiltration of liver.  No other intra-abdominal or intrapelvic abnormalities.  Electronically Signed   By: Lavonia Dana M.D.   On: 05/11/2022 08:20    Past Medical History:  Diagnosis Date   Colon polyp    Gallstones    GERD (gastroesophageal reflux disease)    Hyperlipidemia    Postmenopausal hormone replacement therapy    Past Surgical History:  Procedure Laterality Date   CHOLECYSTECTOMY N/A 07/05/2017   Procedure: LAPAROSCOPIC CHOLECYSTECTOMY;  Surgeon: Coralie Keens, MD;  Location: [facility];  Service: General;  Laterality: N/A;   SHOULDER ARTHROSCOPY Left 1990s   "frozen shoulder"   TIBIA FRACTURE SURGERY  2009   rod placed    TONSILLECTOMY     TOTAL ABDOMINAL HYSTERECTOMY N/A ~ 2000    TUBAL LIGATION N/A ~ 1995   Family History  Problem Relation Age of Onset   Thyroid disease Mother    Osteoporosis Mother    Prostate cancer Father    Colon cancer Neg Hx    Esophageal cancer Neg Hx    Rectal cancer Neg Hx    Social History   Tobacco Use   Smoking status: Never   Smokeless tobacco: Never  Vaping Use   Vaping Use: Never used  Substance Use Topics   Alcohol use: No   Drug use: No   Current Outpatient Medications  Medication Sig Dispense Refill   amLODipine (NORVASC) 10 MG tablet TAKE 1 TABLET BY MOUTH EVERY DAY 90 tablet 1   Azelastine HCl 0.15 % SOLN  Place 2 sprays into both nostrils 2 (two) times daily. 23 mL 1   calcium carbonate (CALCIUM 600) 600 MG TABS tablet Take 1 tablet (600 mg total) by mouth 2 (two) times daily with a meal. (Patient taking differently: Take 600 mg by mouth daily with breakfast.) 60 tablet 1   cetirizine (ZYRTEC) 10 MG tablet Take 10 mg by mouth daily.     Cholecalciferol (VITAMIN D3) 2000 units TABS Take 1 tablet by mouth daily. 30 tablet 2   famotidine (PEPCID) 20 MG tablet TAKE 1 TABLET BY MOUTH TWICE A DAY 180 tablet 1   fluticasone (FLONASE) 50 MCG/ACT nasal spray Place 2 sprays into both nostrils daily. 16 g 6   losartan-hydrochlorothiazide (HYZAAR) 50-12.5 MG tablet TAKE 1 TABLET BY MOUTH EVERY DAY 90 tablet 3   pantoprazole (PROTONIX) 40 MG tablet Take 1 tablet (40 mg total) by mouth 2 (two) times daily. 180 tablet 0   rosuvastatin (CRESTOR) 10 MG tablet TAKE 1 TABLET BY MOUTH EVERY DAY. 90 tablet 3   sertraline (ZOLOFT) 100 MG tablet TAKE 1 TABLET BY MOUTH EVERY DAY 90 tablet 1   No current facility-administered medications for this visit.   No Known Allergies   Review of Systems:  All other systems reviewed and negative except where noted in HPI.   Wt Readings from Last 3 Encounters:  07/15/22 146 lb (66.2 kg)  05/11/22 145 lb (65.8 kg)  04/19/22 150 lb (68 kg)    Physical Exam:  BP 124/68   Pulse 66   Ht 5'  3" (1.6 m)   Wt 145 lb (65.8 kg)   BMI 25.69 kg/m  Constitutional:  Pleasant, generally well appearing female in no acute distress. Psychiatric:  Normal mood and affect. Behavior is normal. EENT: Pupils normal.  Conjunctivae are normal. No scleral icterus. Neck supple.  Cardiovascular: Normal rate, regular rhythm.  Pulmonary/chest: Effort normal and breath sounds normal. No wheezing, rales or rhonchi. Abdominal: Soft, nondistended, nontender. Bowel sounds active throughout. There are no masses palpable. No hepatomegaly. Neurological: Alert and oriented to person place and time. Skin: Skin is warm and dry. No rashes noted.  Tye Savoy, NP  08/04/2022, 2:24 PM  Cc:  Referring Provider Leeanne Rio, MD

## 2022-08-04 NOTE — Patient Instructions (Addendum)
You have been scheduled for an endoscopy. Please follow written instructions given to you at your visit today. If you use inhalers (even only as needed), please bring them with you on the day of your procedure.   If your blood pressure at your visit was 140/90 or greater, please contact your primary care physician to follow up on this.   If you are age 70 or older, your body mass index should be between 23-30. Your Body mass index is 25.69 kg/m. If this is out of the aforementioned range listed, please consider follow up with your Primary Care Provider.  If you are age 56 or younger, your body mass index should be between 19-25. Your Body mass index is 25.69 kg/m. If this is out of the aformentioned range listed, please consider follow up with your Primary Care Provider.   The [facility] providers would like to encourage you to use [facility] to communicate with providers for non-urgent requests or questions.  Due to long hold times on the telephone, sending your provider a message by [facility] may be a faster and more efficient way to get a response.  Please allow 48 business hours for a response.  Please remember that this is for non-urgent requests.   Due to recent changes in healthcare laws, you may see the results of your imaging and laboratory studies on MyChart before your provider has had a chance to review them.  We understand that in some cases there may be results that are confusing or concerning to you. Not all laboratory results come back in the same time frame and the provider may be waiting for multiple results in order to interpret others.  Please give [place] 48 hours in order for your provider to thoroughly review all the results before contacting the office for clarification of your results.   It was a pleasure to see you today!  Thank you for trusting me with your gastrointestinal care!    Tye Savoy NP

## 2022-08-05 NOTE — Progress Notes (Signed)
Agree with the assessment and plan as outlined by Tye Savoy, NP.  Agree with 7-year interval colonoscopy following removal of single small SSP in 2019.  Kacee Sukhu E. Candis Schatz, MD [facility]

## 2022-08-10 DIAGNOSIS — Z0100 Encounter for examination of eyes and vision without abnormal findings: Secondary | ICD-10-CM | POA: Diagnosis not present

## 2022-08-23 ENCOUNTER — Ambulatory Visit: Payer: Medicare HMO | Admitting: Family Medicine

## 2022-08-23 ENCOUNTER — Ambulatory Visit: Payer: Medicare HMO | Admitting: Physician Assistant

## 2022-08-26 ENCOUNTER — Telehealth: Payer: Self-pay

## 2022-08-26 ENCOUNTER — Encounter: Payer: Self-pay | Admitting: Gastroenterology

## 2022-08-26 ENCOUNTER — Ambulatory Visit (AMBULATORY_SURGERY_CENTER): Payer: Medicare HMO | Admitting: Gastroenterology

## 2022-08-26 VITALS — BP 118/64 | HR 65 | Temp 97.3°F | Resp 14 | Ht 63.0 in | Wt 145.0 lb

## 2022-08-26 DIAGNOSIS — K219 Gastro-esophageal reflux disease without esophagitis: Secondary | ICD-10-CM | POA: Diagnosis not present

## 2022-08-26 DIAGNOSIS — R1013 Epigastric pain: Secondary | ICD-10-CM

## 2022-08-26 MED ORDER — SODIUM CHLORIDE 0.9 % IV SOLN: 500.0000 mL | INTRAVENOUS | Status: DC

## 2022-08-26 NOTE — Patient Instructions (Signed)
Resume previous diet and medications. Titrate to lowest effective dose of PPI. Continue anti-reflux dietary measures/behavioral modifications. Can consider Hernia repair/Fundoplication if desired. Handout provided on Hiatal Hernia.  YOU HAD AN ENDOSCOPIC PROCEDURE TODAY AT [facility]:   Refer to the procedure report that was given to you for any specific questions about what was found during the examination.  If the procedure report does not answer your questions, please call your gastroenterologist to clarify.  If you requested that your care partner not be given the details of your procedure findings, then the procedure report has been included in a sealed envelope for you to review at your convenience later.  YOU SHOULD EXPECT: Some feelings of bloating in the abdomen. Passage of more gas than usual.  Walking can help get rid of the air that was put into your GI tract during the procedure and reduce the bloating. If you had a lower endoscopy (such as a colonoscopy or flexible sigmoidoscopy) you may notice spotting of blood in your stool or on the toilet paper. If you underwent a bowel prep for your procedure, you may not have a normal bowel movement for a few days.  Please Note:  You might notice some irritation and congestion in your nose or some drainage.  This is from the oxygen used during your procedure.  There is no need for concern and it should clear up in a day or so.  SYMPTOMS TO REPORT IMMEDIATELY:  Following upper endoscopy (EGD)  Vomiting of blood or coffee ground material  New chest pain or pain under the shoulder blades  Painful or persistently difficult swallowing  New shortness of breath  Fever of 100F or higher  Black, tarry-looking stools  For urgent or emergent issues, a gastroenterologist can be reached at any hour by calling [phone]. Do not use MyChart messaging for urgent concerns.    DIET:  We do recommend a small meal at first, but then you  may proceed to your regular diet.  Drink plenty of fluids but you should avoid alcoholic beverages for 24 hours.  ACTIVITY:  You should plan to take it easy for the rest of today and you should NOT DRIVE or use heavy machinery until tomorrow (because of the sedation medicines used during the test).    FOLLOW UP: Our staff will call the number listed on your records the next business day following your procedure.  We will call around 7:15- 8:00 am to check on you and address any questions or concerns that you may have regarding the information given to you following your procedure. If we do not reach you, we will leave a message.     If any biopsies were taken you will be contacted by phone or by letter within the next 1-3 weeks.  Please call us at [phone] if you have not heard about the biopsies in 3 weeks.    SIGNATURES/CONFIDENTIALITY: You and/or your care partner have signed paperwork which will be entered into your electronic medical record.  These signatures attest to the fact that that the information above on your After Visit Summary has been reviewed and is understood.  Full responsibility of the confidentiality of this discharge information lies with you and/or your care-partner.

## 2022-08-26 NOTE — Telephone Encounter (Signed)
-----   Message from Daryel November, MD sent at 08/26/2022  1:20 PM EDT ----- Regarding: Appointment with Dr. Bryan Lemma for possible TIF [place],  Can you please set up [name] with an office visit to discuss TIF with Dr. Bryan Lemma?  VC,  Saw this patient today for EGD (seen by Janett Billow in clinic).  Refractory GERD symptoms despite BID PPI/qhs H2RA.  EGD with irregular Z-line, mild inflammatory changes in distal esophagus, but no overt esophagitis.  Hill Grade IV with estimated 2-3 cm transverse HH.  Thanks

## 2022-08-26 NOTE — Op Note (Signed)
[facility] Patient Name: Quinterra Henkelman Procedure Date: 08/26/2022 10:40 AM MRN: [id number] Endoscopist: Nicki Reaper E. Candis Schatz , MD, [id number] Age: 72 Referring MD:  Date of Birth: 04/08/1951 Gender: Female Account #: [id number] Procedure:                Upper GI endoscopy Indications:              Esophageal reflux symptoms that persist despite                            appropriate therapy Medicines:                Monitored Anesthesia Care Procedure:                Pre-Anesthesia Assessment:                           - Prior to the procedure, a History and Physical                            was performed, and patient medications and                            allergies were reviewed. The patient's tolerance of                            previous anesthesia was also reviewed. The risks                            and benefits of the procedure and the sedation                            options and risks were discussed with the patient.                            All questions were answered, and informed consent                            was obtained. Prior Anticoagulants: The patient has                            taken no anticoagulant or antiplatelet agents. ASA                            Grade Assessment: II - A patient with mild systemic                            disease. After reviewing the risks and benefits,                            the patient was deemed in satisfactory condition to                            undergo the procedure.  After obtaining informed consent, the endoscope was                            passed under direct vision. Throughout the                            procedure, the patient's blood pressure, pulse, and                            oxygen saturations were monitored continuously. The                            GIF [id number] was introduced through the                            mouth, and advanced to the  second part of duodenum.                            The upper GI endoscopy was accomplished without                            difficulty. The patient tolerated the procedure                            well. Scope In: Scope Out: Findings:                 The examined portions of the nasopharynx,                            oropharynx and larynx were normal.                           The distal esophagus was moderately tortuous. There                            were mild inflammatory changes in the distal                            esophagus (edema, granularity) but no mucosal                            breaks or significant erythema                           The Z-line was irregular and was found at the                            gastroesophageal junction.                           The gastroesophageal flap valve was visualized                            endoscopically and classified as Hill Grade IV (no  fold, wide open lumen, hiatal hernia present).                           The exam of the esophagus was otherwise normal.                           A 3 cm hiatal hernia was present.                           The exam of the stomach was otherwise normal.                           The examined duodenum was normal. Complications:            No immediate complications. Estimated Blood Loss:     Estimated blood loss: none. Impression:               - The examined portions of the nasopharynx,                            oropharynx and larynx were normal.                           - Tortuous distal esophagus.                           - Z-line irregular                           - Gastroesophageal flap valve classified as Hill                            Grade IV (no fold, wide open lumen, hiatal hernia                            present).                           - 3 cm hiatal hernia.                           - Normal examined duodenum.                           -  No specimens collected. Recommendation:           - Patient has a contact number available for                            emergencies. The signs and symptoms of potential                            delayed complications were discussed with the                            patient. Return to normal activities tomorrow.  Written discharge instructions were provided to the                            patient.                           - Resume previous diet.                           - Continue present medications, titrate to lowest                            effective PPI dose                           - Continue anti-reflux dietary measures/behavioral                            modifications                           - Can consider hernia repair/fundoplication if                            desired. Lenus Trauger E. Candis Schatz, MD 08/26/2022 11:01:16 AM This report has been signed electronically.

## 2022-08-26 NOTE — Progress Notes (Signed)
Pt's states no medical or surgical changes since previsit or office visit. 

## 2022-08-26 NOTE — Progress Notes (Signed)
History and Physical Interval Note:  08/26/2022 10:38 AM  [name]  has presented today for endoscopic procedure(s), with the diagnosis of  Encounter Diagnosis  Name Primary?   Epigastric discomfort Yes  .  The various methods of evaluation and treatment have been discussed with the patient and/or family. After consideration of risks, benefits and other options for treatment, the patient has consented to  the endoscopic procedure(s).   The patient's history has been reviewed, patient examined, no change in status, stable for endoscopic procedure(s).  I have reviewed the patient's chart and labs.  Questions were answered to the patient's satisfaction.     Maico Mulvehill E. Candis Schatz, MD [facility]

## 2022-08-27 ENCOUNTER — Telehealth: Payer: Self-pay

## 2022-08-27 NOTE — Telephone Encounter (Signed)
  Follow up Call-     08/26/2022    9:43 AM  Call back number  Post procedure Call Back phone  # [phone]  Permission to leave phone message Yes     Patient questions:  Do you have a fever, pain , or abdominal swelling? No. Pain Score  0 *  Have you tolerated food without any problems? Yes.    Have you been able to return to your normal activities? Yes.    Do you have any questions about your discharge instructions: Diet   No. Medications  No. Follow up visit  No.  Do you have questions or concerns about your Care? No.  Actions: * If pain score is 4 or above: No action needed, pain <4.

## 2022-08-27 NOTE — Telephone Encounter (Signed)
Called and spoke with patient. Patient has been scheduled for an office visit with Dr. Barron Alvine on Friday, 10/29/22 at 2:20 pm. Patient verbalized understanding and had no concerns at the end of the call.

## 2022-09-02 ENCOUNTER — Telehealth: Payer: Self-pay | Admitting: Family Medicine

## 2022-09-02 NOTE — Telephone Encounter (Signed)
Contacted [name] to schedule their annual wellness visit. Patient declined to schedule AWV at this time.  Thank you,  [facility] Support [facility] Direct dial  [phone]

## 2022-09-09 ENCOUNTER — Ambulatory Visit (INDEPENDENT_AMBULATORY_CARE_PROVIDER_SITE_OTHER): Payer: Medicare HMO | Admitting: Family Medicine

## 2022-09-09 ENCOUNTER — Ambulatory Visit: Payer: Medicare HMO | Admitting: Nurse Practitioner

## 2022-09-09 ENCOUNTER — Encounter: Payer: Self-pay | Admitting: Family Medicine

## 2022-09-09 VITALS — BP 120/58 | HR 68 | Ht 63.0 in | Wt 141.4 lb

## 2022-09-09 DIAGNOSIS — I10 Essential (primary) hypertension: Secondary | ICD-10-CM | POA: Diagnosis not present

## 2022-09-09 DIAGNOSIS — K219 Gastro-esophageal reflux disease without esophagitis: Secondary | ICD-10-CM | POA: Diagnosis not present

## 2022-09-09 NOTE — Assessment & Plan Note (Signed)
Pt has had reflux for many years but had an acute flair a few months ago. She had an EGD that found an enlarging hiatal hernia--she is seeing surgery 10/29/22 for hiatal hernia repair consult. GERD is now well controlled with  Protonix bid and dietary changes. Continue current regimen and follow up as needed.

## 2022-09-09 NOTE — Progress Notes (Signed)
     SUBJECTIVE:   CHIEF COMPLAINT / HPI:   [name] is a 72 yo female presenting for a check up on GERD.  GERD: She has had reflux symptoms for many years but had a severe flair a few months ago. She saw gastro for an EGD and was found to have a hiatal hernia that had increased in size. She has since increased her protonix to  twice a day and has made dietary changes to prevent reflux. Her symptoms have greatly improved since these changes. She is scheduled to see GI proceduralist on 10/29/22 to evaluate for possible endoscopic hiatal hernia repair. Denies nausea, vomiting, diarrhea/constipation, weight changes, or bloody stools.  Hypertension: Currently taking amlodipine 10 mg daily and losartan-HCTZ 50-12.5 mg daily.  Tolerating these medications well.  PERTINENT  PMH / PSH: GERD, HTN, HLD  OBJECTIVE:   BP (!) 120/58   Pulse 68   Ht  (1.6 m)   Wt 141 lb 6.4 oz (64.1 kg)   SpO2 98%   BMI 25.05 kg/m    Gen: alert, well appearing, in no acute distress HEENT: normocephalic, atraumatic CV: RRR, no MRG Pulm: clear to auscultation bilaterally Ab: Soft, no tenderness or masses Ext: no edema   ASSESSMENT/PLAN:   GERD (gastroesophageal reflux disease) Pt has had reflux for many years but had an acute flair a few months ago. She had an EGD that found an enlarging hiatal hernia--she is seeing surgery 10/29/22 for hiatal hernia repair consult. GERD is now well controlled with  Protonix bid and dietary changes. Continue current regimen and follow up as needed.  Hypertension HTN is well controlled. Continue current regimen and follow up in 6 months.   Follow-up in 6 months, sooner if needed  Barrett Shell, Medical Student [facility]   Patient seen along with medical student Will Dabbs. I personally evaluated this patient along with the student, and verified all aspects of the history, physical exam, and medical decision making as documented  by the student. I agree with the student's documentation and have made all necessary edits.  Levert Feinstein, MD  [facility]

## 2022-09-09 NOTE — Patient Instructions (Signed)
It was great to see you again today.  Stay on current medications Follow up with me in 6 months, sooner if needed  Be well, Dr. [name]

## 2022-09-09 NOTE — Assessment & Plan Note (Signed)
HTN is well controlled. Continue current regimen and follow up in 6 months.

## 2022-09-16 DIAGNOSIS — M5451 Vertebrogenic low back pain: Secondary | ICD-10-CM | POA: Diagnosis not present

## 2022-10-12 ENCOUNTER — Other Ambulatory Visit: Payer: Self-pay | Admitting: Family Medicine

## 2022-10-29 ENCOUNTER — Ambulatory Visit: Payer: Medicare HMO | Admitting: Gastroenterology

## 2022-10-29 ENCOUNTER — Encounter: Payer: Self-pay | Admitting: Gastroenterology

## 2022-10-29 VITALS — BP 120/80 | HR 83 | Ht 63.0 in | Wt 143.0 lb

## 2022-10-29 DIAGNOSIS — K449 Diaphragmatic hernia without obstruction or gangrene: Secondary | ICD-10-CM | POA: Diagnosis not present

## 2022-10-29 DIAGNOSIS — K219 Gastro-esophageal reflux disease without esophagitis: Secondary | ICD-10-CM | POA: Diagnosis not present

## 2022-10-29 NOTE — Patient Instructions (Addendum)
You have been scheduled for a gastric emptying scan at [facility] on 11/22/22 at 7:30 AM. Please arrive at least 7:00 AM prior to your appointment for registration. Please make certain not to have anything to eat or drink after midnight the night before your test. Hold all stomach medications (ex: Zofran, phenergan, Reglan) 24 hours prior to your test. If you need to reschedule your appointment, please contact radiology scheduling at [phone]. _____________________________________________________________________ A gastric-emptying study measures how long it takes for food to move through your stomach. There are several ways to measure stomach emptying. In the most common test, you eat food that contains a small amount of radioactive material. A scanner that detects the movement of the radioactive material is placed over your abdomen to monitor the rate at which food leaves your stomach. This test normally takes about 4 hours to complete. _____________________________________________________________________   [name] have been scheduled for a Barium Esophogram at [facility] (1st floor of the hospital) on 11/19/22 at 11:00 AM. Please arrive at 10:30 AM prior to your appointment for registration. Make certain not to have anything to eat or drink 3 hours prior to your test. If you need to reschedule for any reason, please contact radiology at [phone] to do so. __________________________________________________________________ A barium swallow is an examination that concentrates on views of the esophagus. This tends to be a double contrast exam (barium and two liquids which, when combined, create a gas to distend the wall of the oesophagus) or single contrast (non-ionic iodine based). The study is usually tailored to your symptoms so a good history is essential. Attention is paid during the study to the form, structure and configuration of the esophagus, looking for functional disorders  (such as aspiration, dysphagia, achalasia, motility and reflux) EXAMINATION You may be asked to change into a gown, depending on the type of swallow being performed. A radiologist and radiographer will perform the procedure. The radiologist will advise you of the type of contrast selected for your procedure and direct you during the exam. You will be asked to stand, sit or lie in several different positions and to hold a small amount of fluid in your mouth before being asked to swallow while the imaging is performed .In some instances you may be asked to swallow barium coated marshmallows to assess the motility of a solid food bolus. The exam can be recorded as a digital or video fluoroscopy procedure. POST PROCEDURE It will take 1-2 days for the barium to pass through your system. To facilitate this, it is important, unless otherwise directed, to increase your fluids for the next 24-48hrs and to resume your normal diet.  This test typically takes about 30 minutes to perform. __________________________________________________________________________________   [name] will be contacted by [facility] to schedule a consult.  [facility] is located at [address], [address].   Phone No# [phone].  _______________________________________________________  If your blood pressure at your visit was 140/90 or greater, please contact your primary care physician to follow up on this.  _______________________________________________________  If you are age 70 or older, your body mass index should be between 23-30. Your Body mass index is 25.33 kg/m. If this is out of the aforementioned range listed, please consider follow up with your Primary Care Provider.  _________________________________________________________  The [facility] providers would like to encourage you to use [facility] to communicate with providers for non-urgent requests or questions.  Due to long hold  times on the telephone, sending  your provider a message by [facility] may be a faster and more efficient way to get a response.  Please allow 48 business hours for a response.  Please remember that this is for non-urgent requests.  Due to recent changes in healthcare laws, you may see the results of your imaging and laboratory studies on MyChart before your provider has had a chance to review them.  We understand that in some cases there may be results that are confusing or concerning to you. Not all laboratory results come back in the same time frame and the provider may be waiting for multiple results in order to interpret others.  Please give [place] 48 hours in order for your provider to thoroughly review all the results before contacting the office for clarification of your results.   Thank you for choosing me and [facility].  Vito Cirigliano, D.O.

## 2022-10-29 NOTE — Progress Notes (Signed)
Chief Complaint:    GERD, hiatal hernia, discuss antireflux surgical options  HPI:     Patient is a 72 y.o. female with a history of HTN, HLD, nephrolithiasis, cholelithiasis, colon polyps, GERD, referred to me by Dr. Tomasa Rand for evaluation of possible antireflux intervention with Transoral Incisionless Fundoplication (TIF) with a goal to stop or significantly reduce acid suppression therapy.  Longstanding history of GERD.  Symptoms have been well-controlled on Protonix 20 mg daily and Pepcid 20 mg at nighttime for years, but started having breakthrough symptoms earlier this year with regurgitation, dyspepsia, nausea/vomiting.  Symptoms particularly worse at nighttime/supine.  Started sleeping with HOB elevated and made significant dietary modifications, avoiding caffeinated beverages, and increased PPI to bid, increased H2RA to BID with overall improvement.  GERD history: -Index symptoms: Heartburn, regurgitation, chronic cough, waterbrash, globus sensation.  No dysphagia -Exacerbating features: Supine/nighttime -Medications trialed: Pantoprazole, Pepcid ; Tums -Current medications: Pantoprazole 40 mg bid, Pepcid 20 mg BID -Complications: Hiatal hernia  GERD evaluation: -Last EGD: 08/2022 -Barium esophagram: None.  Ordered today  -Esophageal Manometry: None -pH/Impedance: None -Bravo: None  Endoscopic History: - 04/23/2019: EGD: Small hiatal hernia, gastritis (images reviewed and appears to have Hill grade 3 valve on retroflexion) - 08/26/2022: EGD: Moderately tortuous lower esophagus with mild inflammatory changes, Hill grade 4 valve, 3 cm HH.  Normal stomach and duodenum   GERD-HRQL Questionnaire Score: 13/50 (on PPI/H2 RA) and marked "dissatisfied" with current health condition related to reflux      Latest Ref Rng & Units 05/11/2022    5:51 AM 02/15/2022    3:18 PM 10/14/2017    9:31 AM  CBC  WBC 4.0 - 10.5 K/uL 6.5  7.0  6.9   Hemoglobin 12.0 - 15.0 g/dL 16.1  09.6  04.5    Hematocrit 36.0 - 46.0 % 42.1  44.0  44.9   Platelets 150 - 400 K/uL 320  327  314       Latest Ref Rng & Units 05/11/2022    5:51 AM 03/03/2022   11:28 AM 02/15/2022    3:18 PM  CMP  Glucose 70 - 99 mg/dL 409  87  92   BUN 8 - 23 mg/dL 13  15  13    Creatinine 0.44 - 1.00 mg/dL 8.11  9.14  7.82   Sodium 135 - 145 mmol/L 141  138  141   Potassium 3.5 - 5.1 mmol/L 3.6  4.6  4.6   Chloride 98 - 111 mmol/L 106  101  101   CO2 22 - 32 mmol/L 25  25  25    Calcium 8.9 - 10.3 mg/dL 9.4  9.7  95.6   Total Protein 6.5 - 8.1 g/dL 7.4   7.0   Total Bilirubin 0.3 - 1.2 mg/dL 0.3   0.4   Alkaline Phos 38 - 126 U/L 66   83   AST 15 - 41 U/L 22   22   ALT 0 - 44 U/L 39   25       Review of systems:     No chest pain, no SOB, no fevers, no urinary sx   Past Medical History:  Diagnosis Date   Colon polyp    Gallstones    GERD (gastroesophageal reflux disease)    High blood pressure    Hyperlipidemia    Postmenopausal hormone replacement therapy     Patient's surgical history, family medical history, social history, medications and allergies were all reviewed in Epic    Current  Outpatient Medications  Medication Sig Dispense Refill   amLODipine (NORVASC) 10 MG tablet TAKE 1 TABLET BY MOUTH EVERY DAY 90 tablet 1   Azelastine HCl 0.15 % SOLN Place 2 sprays into both nostrils 2 (two) times daily. 23 mL 1   calcium carbonate (CALCIUM 600) 600 MG TABS tablet Take 1 tablet (600 mg total) by mouth 2 (two) times daily with a meal. (Patient taking differently: Take 600 mg by mouth daily with breakfast.) 60 tablet 1   Cholecalciferol (VITAMIN D3) 2000 units TABS Take 1 tablet by mouth daily. 30 tablet 2   famotidine (PEPCID) 20 MG tablet TAKE 1 TABLET BY MOUTH TWICE A DAY 180 tablet 1   fexofenadine (ALLEGRA) 180 MG tablet Take 180 mg by mouth daily.     losartan-hydrochlorothiazide (HYZAAR) 50-12.5 MG tablet TAKE 1 TABLET BY MOUTH EVERY DAY 90 tablet 3   pantoprazole (PROTONIX) 40 MG tablet  TAKE 1 TABLET BY MOUTH TWICE A DAY 180 tablet 1   rosuvastatin (CRESTOR) 10 MG tablet TAKE 1 TABLET BY MOUTH EVERY DAY. 90 tablet 3   No current facility-administered medications for this visit.    Physical Exam:     BP 120/80   Pulse 83   Ht 5\' 3"  (1.6 m)   Wt 143 lb (64.9 kg)   BMI 25.33 kg/m   GENERAL:  Pleasant female in NAD PSYCH: : Cooperative, normal affect Musculoskeletal:  Normal muscle tone, normal strength NEURO: Alert and oriented x 3, no focal neurologic deficits   IMPRESSION and PLAN:    1) GERD 2) Hiatal hernia 72 year old female with longstanding history of GERD, with recent increase in reflux symptoms necessitating high-dose acid suppression therapy and significant dietary/lifestyle modifications.  Reviewed previous endoscopy procedures, and certainly conceivable that her increase in reflux symptoms is related to progression of hiatal hernia, and now most bothersome symptom is regurgitation and return of intermittent cough.  Discussed the pathophysiology of reflux at length today, to include continued regurgitation despite appropriate acid suppression therapy, predominantly due to anatomic dysfunction.  Discussed role/utility of antireflux surgery, to include cTIF, Nissen fundoplication, etc., and she would like to proceed with concomitant laparoscopic hiatal hernia pair and TIF.  Plan as follows:  - Order barium esophagram for radiographic views of the hiatal hernia and evaluate presence/severity of refluxate - If esophagram unrevealing, will likely plan for esophageal manometry and pH/impedance testing - Referral to Dr. Andrey Campanile at [facility] for discussion of concomitant laparoscopic hiatal hernia repair with TIF (with final dtermination pending esophaggram and GES finidings) - Given presence of nausea/vomiting, order GES for preoperative assessment - Continue pantoprazole and Pepcid as currently prescribed - Continue antireflux lifestyle/dietary modifications - Discussed  the postoperative dietary restrictions - Discussed the risks, benefits, alternatives of Transoral Incisionless Fundoplication (TIF) at length today      I spent 35 minutes of time, including in depth chart review, independent review of results as outlined above, communicating results with the patient directly, face-to-face time with the patient, coordinating care, and ordering studies and medications as appropriate, and documentation.       Shellia Cleverly ,DO, FACG 10/29/2022, 1:44 PM

## 2022-11-19 ENCOUNTER — Ambulatory Visit (HOSPITAL_COMMUNITY)
Admission: RE | Admit: 2022-11-19 | Discharge: 2022-11-19 | Disposition: A | Discharge status: Home / Self Care | Payer: Medicare HMO | Source: Ambulatory Visit | Attending: Physician Assistant | Admitting: Physician Assistant

## 2022-11-19 DIAGNOSIS — K449 Diaphragmatic hernia without obstruction or gangrene: Secondary | ICD-10-CM | POA: Insufficient documentation

## 2022-11-19 DIAGNOSIS — K224 Dyskinesia of esophagus: Secondary | ICD-10-CM | POA: Diagnosis not present

## 2022-11-19 DIAGNOSIS — K219 Gastro-esophageal reflux disease without esophagitis: Secondary | ICD-10-CM | POA: Insufficient documentation

## 2022-11-22 ENCOUNTER — Encounter (HOSPITAL_COMMUNITY)
Admission: RE | Admit: 2022-11-22 | Discharge: 2022-11-22 | Disposition: A | Discharge status: Home / Self Care | Payer: Medicare HMO | Source: Ambulatory Visit | Attending: Physician Assistant | Admitting: Physician Assistant

## 2022-11-22 DIAGNOSIS — K219 Gastro-esophageal reflux disease without esophagitis: Secondary | ICD-10-CM | POA: Insufficient documentation

## 2022-11-22 DIAGNOSIS — K449 Diaphragmatic hernia without obstruction or gangrene: Secondary | ICD-10-CM | POA: Insufficient documentation

## 2022-11-22 DIAGNOSIS — K3184 Gastroparesis: Secondary | ICD-10-CM | POA: Diagnosis not present

## 2022-11-22 MED ORDER — TECHNETIUM TC 99M SULFUR COLLOID
2.0000 | Freq: Once | INTRAVENOUS | Status: AC
  Administered 2022-11-22: 2 via ORAL

## 2022-11-24 ENCOUNTER — Telehealth: Payer: Self-pay

## 2022-11-24 NOTE — Telephone Encounter (Signed)
Spoke with patient in regards to her result. Patient already viewed results via MyChart. Patient made aware of result and recommendations recommended by Dr. Barron Alvine. Informed patient that she will be getting a call from [facility] to evaluate further for gastroparesis and mild esophageal motility. Stated she is going to cancel her appointment with Dr. Andrey Campanile since Dr. Barron Alvine didn't recommend for TIF. Patient verbalized understanding and no other concerns at the end of the call. Referral to [facility] is faxed.

## 2022-11-24 NOTE — Telephone Encounter (Signed)
-----   Message from [place] V, DO sent at 11/23/2022  4:41 PM EDT ----- Results from the esophagram were reviewed and demonstrate small hiatal hernia and large volume gastroesophageal reflux to the level of the upper esophagus.  Mild intermittent esophageal dysmotility with tertiary contractions was noted. 13 mm barium tablet passed freely into the stomach only after slight delay at the GE junction.  Separately, the Gastric emptying study was also reviewed and demonstrates delayed emptying at 2, 3, and 4 hours, with only 61% emptying at 4 hours (normal >90%).  This represents gastroparesis.  Based on these results, I do NOT recommend TIF.  Instead, recommend continued medical management of reflux.  Could consider referral to the [facility] as well for gastroparesis with regurgitation, along with subtle evidence of esophageal dysmotility on esophagram.  Otherwise, can resume outpatient GI care with Dr. Tomasa Rand.

## 2022-12-02 NOTE — Telephone Encounter (Signed)
Inbound call from patient stating she has not received a call from [facility]. Requesting a call back to discuss further. Please advise, thank you.

## 2022-12-02 NOTE — Telephone Encounter (Signed)
[facility] at [phone] to follow up on the referral that was faxed on 11/24/22. Spoke with Morrie Sheldon. Informed me that they have already received the referral. Their doctors are still reviewing it. They will call her by next week with an appointment. Left detailed message to patient advising her to call us or call them directly if she doesn't hear from them by next week. Their contact no. Provided to the patient.

## 2022-12-17 NOTE — Telephone Encounter (Signed)
Called [facility] to check the status of the referral. Stated patient is scheduled for an appointment on 8/26.

## 2022-12-27 ENCOUNTER — Ambulatory Visit (INDEPENDENT_AMBULATORY_CARE_PROVIDER_SITE_OTHER): Payer: Medicare HMO | Admitting: Family Medicine

## 2022-12-27 VITALS — BP 143/66 | HR 70 | Wt 138.6 lb

## 2022-12-27 DIAGNOSIS — F439 Reaction to severe stress, unspecified: Secondary | ICD-10-CM | POA: Diagnosis not present

## 2022-12-27 MED ORDER — ESCITALOPRAM OXALATE 5 MG PO TABS: 5.0000 mg | ORAL_TABLET | Freq: Every day | ORAL | 1 refills | Status: DC

## 2022-12-27 NOTE — Progress Notes (Unsigned)
  Date of Visit: 12/27/2022   SUBJECTIVE:   HPI:  Matha presents today for discussion of anxiety.  Has had increased anxiety lately, mostly surrounding her husband's health.  He has been having memory issues and has been somewhat difficult to reason with in light of those issues.  Previously patient took sertraline but prefers to avoid that when this time.  Amenable to trial of another SSRI.  Does not have a therapist but is agreeable to going.  Denies thoughts of harming herself or others.  OBJECTIVE:   BP (!) 143/66   Pulse 70   Wt 138 lb 9.6 oz (62.9 kg)   SpO2 100%   BMI 24.55 kg/m  Gen: No acute distress, pleasant, cooperative, well-appearing HEENT: Normocephalic, atraumatic Psych: normal range of affect, primarily anxious appearing, well groomed, speech normal in rate and volume, normal eye contact   ASSESSMENT/PLAN:   Stress Recently worsened in light of stress/anxiety over husband's health and cognition.  Amenable to restarting SSRI.  Will initiate Lexapro 5 mg daily.  Follow-up with me in about 6 weeks (soonest we could align our schedules).  She knows to follow-up sooner if any new issues arise.  Also discussed importance of therapy, provided information on how to schedule with a therapist that will take her insurance.  FOLLOW UP: Follow up in 6 weeks for stress/anxiety  [place] J. [name], MD [facility]

## 2022-12-27 NOTE — Patient Instructions (Signed)
It was great to see you again today.  Start lexapro 5mg  daily Follow up with me as scheduled in September Call if message with any concerns in the meantime  For information on therapists, please go to [email or web address]. You can also contact your insurance company to find an in-network therapist.   Be well, Dr. [name]

## 2022-12-29 NOTE — Assessment & Plan Note (Addendum)
Recently worsened in light of stress/anxiety over husband's health and cognition.  Amenable to restarting SSRI.  Will initiate Lexapro 5 mg daily.  Follow-up with me in about 6 weeks (soonest we could align our schedules).  She knows to follow-up sooner if any new issues arise.  Also discussed importance of therapy, provided information on how to schedule with a therapist that will take her insurance.

## 2023-01-03 ENCOUNTER — Telehealth: Payer: Self-pay

## 2023-01-03 ENCOUNTER — Encounter: Payer: Self-pay | Admitting: Family Medicine

## 2023-01-03 DIAGNOSIS — F439 Reaction to severe stress, unspecified: Secondary | ICD-10-CM

## 2023-01-03 NOTE — Telephone Encounter (Signed)
Patient calls nurse line regarding Lexapro prescription.   She is requesting to be increased to 10 mg as she is still having "a lot of anxiety."   She reports no adverse side effects to the current 5 mg.   Please advise.   Veronda Prude, RN

## 2023-01-03 NOTE — Telephone Encounter (Signed)
Sent her a mychart message

## 2023-01-05 DIAGNOSIS — F4323 Adjustment disorder with mixed anxiety and depressed mood: Secondary | ICD-10-CM | POA: Diagnosis not present

## 2023-01-05 MED ORDER — ESCITALOPRAM OXALATE 10 MG PO TABS: 10.0000 mg | ORAL_TABLET | Freq: Every day | ORAL | 1 refills | Status: DC

## 2023-01-13 DIAGNOSIS — F4323 Adjustment disorder with mixed anxiety and depressed mood: Secondary | ICD-10-CM | POA: Diagnosis not present

## 2023-01-17 DIAGNOSIS — K3184 Gastroparesis: Secondary | ICD-10-CM | POA: Diagnosis not present

## 2023-01-19 ENCOUNTER — Other Ambulatory Visit: Payer: Self-pay | Admitting: Family Medicine

## 2023-01-20 DIAGNOSIS — F4322 Adjustment disorder with anxiety: Secondary | ICD-10-CM | POA: Diagnosis not present

## 2023-01-25 DIAGNOSIS — F4322 Adjustment disorder with anxiety: Secondary | ICD-10-CM | POA: Diagnosis not present

## 2023-01-31 ENCOUNTER — Other Ambulatory Visit: Payer: Self-pay | Admitting: Family Medicine

## 2023-02-07 ENCOUNTER — Ambulatory Visit (INDEPENDENT_AMBULATORY_CARE_PROVIDER_SITE_OTHER): Payer: Medicare HMO | Admitting: Family Medicine

## 2023-02-07 ENCOUNTER — Other Ambulatory Visit: Payer: Self-pay

## 2023-02-07 ENCOUNTER — Encounter: Payer: Self-pay | Admitting: Family Medicine

## 2023-02-07 VITALS — BP 136/52 | HR 54 | Ht 63.0 in | Wt 140.0 lb

## 2023-02-07 DIAGNOSIS — Z23 Encounter for immunization: Secondary | ICD-10-CM | POA: Diagnosis not present

## 2023-02-07 DIAGNOSIS — F439 Reaction to severe stress, unspecified: Secondary | ICD-10-CM

## 2023-02-07 NOTE — Progress Notes (Unsigned)
   SUBJECTIVE:   CHIEF COMPLAINT / HPI:   [name] is a 72 y.o. female presenting for follow up.  Anxiety: Patient prescribed Lexapro 5 mg daily at last clinic visit on 12/27/22. Patient requested increase to 10 mg daily on 01/03/23. Reports this medication took time to feel effects. Dry mouth was only side effect endorsed; she reports this is better now. Additionally, she reports seeing a therapist at [facility] and [facility] weekly with recent transition to every 2 weeks. She reports decreased stress and anxiety with addition of medication and therapy.   Vaccinations: Patient requesting flu shot and more information on COVID-19 vaccine.   PERTINENT  PMH / PSH: HTN, GERD, Hyperlipidemia  OBJECTIVE:   BP (!) 136/52   Pulse (!) 54   Ht 5\' 3"  (1.6 m)   Wt 140 lb (63.5 kg)   SpO2 100%   BMI 24.80 kg/m    General: NAD, pleasant, cooperative Cardiac: RRR, no murmurs Respiratory: Clear to auscultation bilaterally, normal work of breathing Extremities: 2+ dp equal bilaterally   ASSESSMENT/PLAN:   Stress Improving with initation of Lexapro 10 mg and therapy. No concerning medication side effects. Potential of higher dosing discussed; patient defers at this time. Continue current regimen.    Health Maintenance: - Flu shot given today - Patient counseled on COVID-19 vaccine; COVID-19 vaccine given today  FOLLOW-UP: Patient to follow up in 3 months for anxiety or before if needed.  Bubba Hales, Medical Student [facility]

## 2023-02-07 NOTE — Assessment & Plan Note (Addendum)
Improving with initation of Lexapro 10 mg and therapy. No concerning medication side effects. Potential of higher dosing discussed; patient defers at this time. Continue current regimen.

## 2023-02-07 NOTE — Patient Instructions (Signed)
It was great to see you again today.  Stay on the lexapro Flu and COVID shots today  Follow up in 3 months, sooner if needed  Be well, Dr. [name]

## 2023-02-09 DIAGNOSIS — F4322 Adjustment disorder with anxiety: Secondary | ICD-10-CM | POA: Diagnosis not present

## 2023-02-22 DIAGNOSIS — H5203 Hypermetropia, bilateral: Secondary | ICD-10-CM | POA: Diagnosis not present

## 2023-02-22 DIAGNOSIS — H2513 Age-related nuclear cataract, bilateral: Secondary | ICD-10-CM | POA: Diagnosis not present

## 2023-02-23 DIAGNOSIS — F4322 Adjustment disorder with anxiety: Secondary | ICD-10-CM | POA: Diagnosis not present

## 2023-03-15 ENCOUNTER — Other Ambulatory Visit: Payer: Self-pay | Admitting: Family Medicine

## 2023-03-31 DIAGNOSIS — F4322 Adjustment disorder with anxiety: Secondary | ICD-10-CM | POA: Diagnosis not present

## 2023-04-12 ENCOUNTER — Other Ambulatory Visit: Payer: Self-pay | Admitting: Family Medicine

## 2023-04-19 DIAGNOSIS — D2262 Melanocytic nevi of left upper limb, including shoulder: Secondary | ICD-10-CM | POA: Diagnosis not present

## 2023-04-19 DIAGNOSIS — L814 Other melanin hyperpigmentation: Secondary | ICD-10-CM | POA: Diagnosis not present

## 2023-04-19 DIAGNOSIS — I788 Other diseases of capillaries: Secondary | ICD-10-CM | POA: Diagnosis not present

## 2023-04-19 DIAGNOSIS — L821 Other seborrheic keratosis: Secondary | ICD-10-CM | POA: Diagnosis not present

## 2023-04-19 DIAGNOSIS — L570 Actinic keratosis: Secondary | ICD-10-CM | POA: Diagnosis not present

## 2023-04-19 DIAGNOSIS — L820 Inflamed seborrheic keratosis: Secondary | ICD-10-CM | POA: Diagnosis not present

## 2023-05-09 ENCOUNTER — Ambulatory Visit (INDEPENDENT_AMBULATORY_CARE_PROVIDER_SITE_OTHER): Payer: Medicare HMO | Admitting: Family Medicine

## 2023-05-09 ENCOUNTER — Encounter: Payer: Self-pay | Admitting: Family Medicine

## 2023-05-09 VITALS — BP 126/48 | HR 69 | Ht 63.0 in | Wt 147.2 lb

## 2023-05-09 DIAGNOSIS — I10 Essential (primary) hypertension: Secondary | ICD-10-CM | POA: Diagnosis not present

## 2023-05-09 DIAGNOSIS — K219 Gastro-esophageal reflux disease without esophagitis: Secondary | ICD-10-CM | POA: Diagnosis not present

## 2023-05-09 DIAGNOSIS — F439 Reaction to severe stress, unspecified: Secondary | ICD-10-CM

## 2023-05-09 DIAGNOSIS — E785 Hyperlipidemia, unspecified: Secondary | ICD-10-CM | POA: Diagnosis not present

## 2023-05-09 DIAGNOSIS — Z0000 Encounter for general adult medical examination without abnormal findings: Secondary | ICD-10-CM | POA: Diagnosis not present

## 2023-05-09 DIAGNOSIS — Z1231 Encounter for screening mammogram for malignant neoplasm of breast: Secondary | ICD-10-CM | POA: Diagnosis not present

## 2023-05-09 MED ORDER — ESCITALOPRAM OXALATE 10 MG PO TABS: 10.0000 mg | ORAL_TABLET | Freq: Every day | ORAL | 3 refills | Status: DC

## 2023-05-09 MED ORDER — AMLODIPINE BESYLATE 10 MG PO TABS: ORAL_TABLET | ORAL | 3 refills | Status: AC

## 2023-05-09 MED ORDER — LOSARTAN POTASSIUM-HCTZ 50-12.5 MG PO TABS: 1.0000 | ORAL_TABLET | Freq: Every day | ORAL | 3 refills | Status: DC

## 2023-05-09 MED ORDER — ROSUVASTATIN CALCIUM 10 MG PO TABS: ORAL_TABLET | ORAL | 3 refills | Status: AC

## 2023-05-09 MED ORDER — PANTOPRAZOLE SODIUM 40 MG PO TBEC: 40.0000 mg | DELAYED_RELEASE_TABLET | Freq: Two times a day (BID) | ORAL | 1 refills | Status: DC | PRN

## 2023-05-09 NOTE — Assessment & Plan Note (Signed)
Well controlled. Continue current medication regimen. Update bmet today

## 2023-05-09 NOTE — Assessment & Plan Note (Signed)
Stable, refilled PPI

## 2023-05-09 NOTE — Assessment & Plan Note (Signed)
Declines Annual Wellness Visit Otherwise UTD on HM items

## 2023-05-09 NOTE — Progress Notes (Signed)
  Date of Visit: 05/09/2023   SUBJECTIVE:   HPI:  [name] presents today for follow-up.  Hypertension: Currently taking amlodipine 10 mg daily and losartan-HCTZ 50-12.5 mg daily.  Tolerating these meds well.  Stress: Doing much better than previously.  She is taking Lexapro 10 mg daily.  Husband is doing much better.  Stopped seeing therapist but remains open to the idea of going back if needed.  GERD: Taking Pepcid 20 mg twice daily as needed and Protonix 40 mg twice daily as needed.  Stable on this regimen.  Hyperlipidemia: Currently taking rosuvastatin 10 mg daily.  Fasting today except for coffee with milk.  Agreeable to checking labs today.   OBJECTIVE:   BP (!) 126/48   Pulse 69   Ht 5\' 3"  (1.6 m)   Wt 147 lb 3.2 oz (66.8 kg)   SpO2 98%   BMI 26.08 kg/m  Gen: no acute distress, pleasant, cooperative, well appearing HEENT: Normocephalic, atraumatic Heart: Regular rate and rhythm, no murmur Lungs: Clear to auscultation bilaterally, normal effort Neuro: Grossly nonfocal, speech normal Psych: normal range of affect, well groomed, speech normal in rate and volume, normal eye contact   ASSESSMENT/PLAN:   Assessment & Plan Hyperlipidemia, unspecified hyperlipidemia type Update lipids today Continue statin Stress Overall doing well Continue lexapro 10mg  daily Follow up in 1 year, sooner if needed Primary hypertension Well controlled. Continue current medication regimen. Update bmet today Gastroesophageal reflux disease, unspecified whether esophagitis present Stable, refilled PPI Routine adult health maintenance Declines Annual Wellness Visit Otherwise UTD on HM items    FOLLOW UP: Follow up in 1 year for above issues  April J. April Meyer, MD [facility]

## 2023-05-09 NOTE — Assessment & Plan Note (Signed)
Overall doing well Continue lexapro 10mg  daily Follow up in 1 year, sooner if needed

## 2023-05-09 NOTE — Assessment & Plan Note (Signed)
Update lipids today.  Continue statin. 

## 2023-05-09 NOTE — Patient Instructions (Signed)
It was great to see you again today.  Checking kidneys, liver, cholesterol today  Follow up in 1 year, sooner if needed  Be well, Dr. [name]

## 2023-05-10 LAB — CMP14+EGFR
ALT: 37 [IU]/L — ABNORMAL HIGH (ref 0–32)
AST: 28 [IU]/L (ref 0–40)
Albumin: 4.8 g/dL (ref 3.9–4.9)
Alkaline Phosphatase: 94 [IU]/L (ref 44–121)
BUN/Creatinine Ratio: 16 (ref 12–28)
BUN: 13 mg/dL (ref 8–27)
Bilirubin Total: 0.4 mg/dL (ref 0.0–1.2)
CO2: 21 mmol/L (ref 20–29)
Calcium: 9.9 mg/dL (ref 8.7–10.3)
Chloride: 103 mmol/L (ref 96–106)
Creatinine, Ser: 0.8 mg/dL (ref 0.57–1.00)
Globulin, Total: 2.5 g/dL (ref 1.5–4.5)
Glucose: 101 mg/dL — ABNORMAL HIGH (ref 70–99)
Potassium: 4.3 mmol/L (ref 3.5–5.2)
Sodium: 142 mmol/L (ref 134–144)
Total Protein: 7.3 g/dL (ref 6.0–8.5)
eGFR: 79 mL/min/{1.73_m2} (ref >59)

## 2023-05-10 LAB — LIPID PANEL
Chol/HDL Ratio: 2.4 {ratio} (ref 0.0–4.4)
Cholesterol, Total: 168 mg/dL (ref 100–199)
HDL: 69 mg/dL (ref >39)
LDL Chol Calc (NIH): 82 mg/dL (ref 0–99)
Triglycerides: 94 mg/dL (ref 0–149)
VLDL Cholesterol Cal: 17 mg/dL (ref 5–40)

## 2023-06-06 DIAGNOSIS — L570 Actinic keratosis: Secondary | ICD-10-CM | POA: Diagnosis not present

## 2023-12-27 DIAGNOSIS — M1712 Unilateral primary osteoarthritis, left knee: Secondary | ICD-10-CM | POA: Diagnosis not present

## 2024-02-22 NOTE — Progress Notes (Signed)
 [name]                                          MRN: [id number]   02/22/2024   The [facility]BCI Quality Team Specialist reviewed this patient medical record for the purposes of chart review for care gap closure. The following were reviewed: chart review for care gap closure-controlling blood pressure.    VBCI Quality Team

## 2024-03-02 ENCOUNTER — Ambulatory Visit (INDEPENDENT_AMBULATORY_CARE_PROVIDER_SITE_OTHER)

## 2024-03-02 DIAGNOSIS — Z23 Encounter for immunization: Secondary | ICD-10-CM | POA: Diagnosis not present

## 2024-03-02 NOTE — Progress Notes (Signed)
 Patient presents to nurse clinic for flu vaccine.  Vaccine given without complication.  See admin for details.

## 2024-03-06 DIAGNOSIS — H2513 Age-related nuclear cataract, bilateral: Secondary | ICD-10-CM | POA: Diagnosis not present

## 2024-03-06 DIAGNOSIS — H43811 Vitreous degeneration, right eye: Secondary | ICD-10-CM | POA: Diagnosis not present

## 2024-03-06 DIAGNOSIS — H5203 Hypermetropia, bilateral: Secondary | ICD-10-CM | POA: Diagnosis not present

## 2024-03-24 ENCOUNTER — Other Ambulatory Visit: Payer: Self-pay | Admitting: Family Medicine

## 2024-03-26 ENCOUNTER — Encounter: Payer: Self-pay | Admitting: Family Medicine

## 2024-03-26 ENCOUNTER — Ambulatory Visit (INDEPENDENT_AMBULATORY_CARE_PROVIDER_SITE_OTHER): Admitting: Family Medicine

## 2024-03-26 VITALS — BP 131/65 | HR 61 | Ht 63.0 in | Wt 149.4 lb

## 2024-03-26 DIAGNOSIS — F439 Reaction to severe stress, unspecified: Secondary | ICD-10-CM

## 2024-03-26 DIAGNOSIS — E785 Hyperlipidemia, unspecified: Secondary | ICD-10-CM | POA: Diagnosis not present

## 2024-03-26 DIAGNOSIS — R002 Palpitations: Secondary | ICD-10-CM

## 2024-03-26 LAB — POCT GLYCOSYLATED HEMOGLOBIN (HGB A1C): Hemoglobin A1C: 5.8 % — AB (ref 4.0–5.6)

## 2024-03-26 NOTE — Patient Instructions (Addendum)
 It was great to see you again today.  Dr. Orvilla office is going to contact you for an appointment Should get heart monitor in mail  If new chest pain, shortness of breath, etc please go to ED for evaluation    Be well, Dr. Donah

## 2024-03-26 NOTE — Progress Notes (Unsigned)
  Date of Visit: 03/26/2024   SUBJECTIVE:   HPI:  Discussed the use of AI scribe software for clinical note transcription with the patient, who gave verbal consent to proceed.  History of Present Illness [name] is a 73 year old female who presents with palpitations and chest tightness.  Palpitations and chest tightness - Palpitations and sensation of chest tightness occur multiple times daily - Pulse seems to skip beats during episodes - No associated chest pain, shortness of breath, sweating, or nausea - Occasional sharp, infrequent chest pain  Gastroesophageal reflux symptoms and management - History of acid reflux - Surgery for acid reflux was considered one year ago but not approved by insurance due to gastric emptying study results - Current management includes Pepcid  20 mg twice daily and Protonix  40 mg twice daily    OBJECTIVE:   BP 131/65   Pulse 61   Ht 5' 3 (1.6 m)   Wt 149 lb 6.4 oz (67.8 kg)   SpO2 100%   BMI 26.47 kg/m  Gen: no acute distress, pleasant, cooprative HEENT: normocephalic, atraumatic  Heart: regular rate and rhythm, no murmur Lungs: clear to auscultation bilaterally normal work of breathing  Neuro: alert, speech normal, grossly nonfocal Ext: No appreciable lower extremity edema bilaterally   ASSESSMENT/PLAN:   Assessment & Plan Palpitations EKG confirmed PVCs, no signs of ischemia on EKG. Given age and cardiac risk factors (hypertension, hyperlipidemia) would benefit from cardiology evaluation to rule out underlying CAD. - 14d zio patch ordered - Refer to cardiologist Dr. Rolan for further evaluation - messaged him and he arranged an appointment in 2 days - labs for TSH, BMET, A1c - Advise to seek emergency care if experiencing chest pain or dyspnea. Hyperlipidemia, unspecified hyperlipidemia type Update lipids, continue statin Stress Well-managed on Lexapro  10 mg daily. Prefers to continue medication to avoid depressive  episodes.    Vihaan Gloss J. Donah, MD [facility]

## 2024-03-27 ENCOUNTER — Ambulatory Visit: Payer: Self-pay | Admitting: Family Medicine

## 2024-03-27 DIAGNOSIS — R7401 Elevation of levels of liver transaminase levels: Secondary | ICD-10-CM

## 2024-03-27 LAB — CMP14+EGFR
ALT: 42 IU/L — ABNORMAL HIGH (ref 0–32)
AST: 22 IU/L (ref 0–40)
Albumin: 4.6 g/dL (ref 3.8–4.8)
Alkaline Phosphatase: 77 IU/L (ref 49–135)
BUN/Creatinine Ratio: 19 (ref 12–28)
BUN: 15 mg/dL (ref 8–27)
Bilirubin Total: 0.5 mg/dL (ref 0.0–1.2)
CO2: 21 mmol/L (ref 20–29)
Calcium: 9.9 mg/dL (ref 8.7–10.3)
Chloride: 102 mmol/L (ref 96–106)
Creatinine, Ser: 0.78 mg/dL (ref 0.57–1.00)
Globulin, Total: 2.2 g/dL (ref 1.5–4.5)
Glucose: 94 mg/dL (ref 70–99)
Potassium: 4.6 mmol/L (ref 3.5–5.2)
Sodium: 140 mmol/L (ref 134–144)
Total Protein: 6.8 g/dL (ref 6.0–8.5)
eGFR: 81 mL/min/1.73 (ref >59)

## 2024-03-27 LAB — LIPID PANEL
Chol/HDL Ratio: 3.1 ratio (ref 0.0–4.4)
Cholesterol, Total: 145 mg/dL (ref 100–199)
HDL: 47 mg/dL (ref >39)
LDL Chol Calc (NIH): 78 mg/dL (ref 0–99)
Triglycerides: 111 mg/dL (ref 0–149)
VLDL Cholesterol Cal: 20 mg/dL (ref 5–40)

## 2024-03-27 LAB — TSH RFX ON ABNORMAL TO FREE T4: TSH: 2.12 u[IU]/mL (ref 0.450–4.500)

## 2024-03-28 ENCOUNTER — Other Ambulatory Visit (HOSPITAL_COMMUNITY): Payer: Self-pay | Admitting: Cardiology

## 2024-03-28 ENCOUNTER — Ambulatory Visit (HOSPITAL_COMMUNITY)
Admission: RE | Admit: 2024-03-28 | Discharge: 2024-03-28 | Disposition: A | Discharge status: Home / Self Care | Source: Ambulatory Visit | Attending: Cardiology | Admitting: Cardiology

## 2024-03-28 VITALS — BP 146/80 | HR 65 | Wt 151.4 lb

## 2024-03-28 DIAGNOSIS — Z79899 Other long term (current) drug therapy: Secondary | ICD-10-CM | POA: Diagnosis not present

## 2024-03-28 DIAGNOSIS — F419 Anxiety disorder, unspecified: Secondary | ICD-10-CM | POA: Insufficient documentation

## 2024-03-28 DIAGNOSIS — R0789 Other chest pain: Secondary | ICD-10-CM | POA: Insufficient documentation

## 2024-03-28 DIAGNOSIS — I10 Essential (primary) hypertension: Secondary | ICD-10-CM | POA: Diagnosis not present

## 2024-03-28 DIAGNOSIS — K219 Gastro-esophageal reflux disease without esophagitis: Secondary | ICD-10-CM | POA: Insufficient documentation

## 2024-03-28 DIAGNOSIS — I428 Other cardiomyopathies: Secondary | ICD-10-CM | POA: Diagnosis not present

## 2024-03-28 DIAGNOSIS — I493 Ventricular premature depolarization: Secondary | ICD-10-CM

## 2024-03-28 DIAGNOSIS — Z6379 Other stressful life events affecting family and household: Secondary | ICD-10-CM | POA: Diagnosis not present

## 2024-03-28 DIAGNOSIS — R002 Palpitations: Secondary | ICD-10-CM | POA: Insufficient documentation

## 2024-03-28 DIAGNOSIS — E785 Hyperlipidemia, unspecified: Secondary | ICD-10-CM | POA: Diagnosis not present

## 2024-03-28 NOTE — Progress Notes (Signed)
 Zio patch placed onto patient.  All instructions and information reviewed with patient, they verbalize understanding with no questions.

## 2024-03-28 NOTE — Patient Instructions (Signed)
 There has been no changes to your medications.  Your physician has requested that you have an echocardiogram. Echocardiography is a painless test that uses sound waves to create images of your heart. It provides your doctor with information about the size and shape of your heart and how well your heart's chambers and valves are working. This procedure takes approximately one hour. There are no restrictions for this procedure. Please do NOT wear cologne, perfume, aftershave, or lotions (deodorant is allowed). Please arrive 15 minutes prior to your appointment time.  Please note: We ask at that you not bring children with you during ultrasound (echo/ vascular) testing. Due to room size and safety concerns, children are not allowed in the ultrasound rooms during exams. Our front office staff cannot provide observation of children in our lobby area while testing is being conducted. An adult accompanying a patient to their appointment will only be allowed in the ultrasound room at the discretion of the ultrasound technician under special circumstances. We apologize for any inconvenience.  Your provider has recommended that  you wear a Zio Patch for 7 days.  This monitor will record your heart rhythm for our review.  IF you have any symptoms while wearing the monitor please press the button.  If you have any issues with the patch or you notice a red or orange light on it please call the company at [phone].  Once you remove the patch please mail it back to the company as soon as possible so we can get the results.   Elsp[facility]ORD[facility] [address]  [place], [place] [address]  If scheduled at [facility], please arrive at the [facility] Children's Entrance (Entrance C2) of [facility] 30 minutes prior to test start time. You can use the FREE valet parking offered at entrance C (encouraged to control the heart rate for the test)  Proceed to the [facility]  Radiology Department (first floor) to check-in and test prep.  All radiology patients and guests should use entrance C2 at [facility], accessed from [facility], even though the hospital's physical address listed is [address].  If scheduled at the Heart and Vascular Tower at [address], please enter the parking lot using the [address] entrance and use the FREE valet service at the patient drop-off area. Enter the building and check-in with registration on the main floor.  An IV will be required for this test and Nitroglycerin will be given.  Hold all erectile dysfunction medications at least 3 days (72 hrs) prior to test. (Ie viagra, cialis, sildenafil, tadalafil, etc)   On the Night Before the Test: Be sure to Drink plenty of water. Do not consume any caffeinated/decaffeinated beverages or chocolate 12 hours prior to your test. Do not take any antihistamines 12 hours prior to your test.  If the patient has contrast allergy: Patient will need a prescription for Prednisone and very clear instructions (as follows): Prednisone 50 mg - take 13 hours prior to test Take another Prednisone 50 mg 7 hours prior to test Take another Prednisone 50 mg 1 hour prior to test Take Benadryl  50 mg 1 hour prior to test Patient must complete all four doses of above prophylactic medications. Patient will need a ride after test due to Benadryl .  On the Day of the Test: Drink plenty of water until 1 hour prior to the test. Do not eat any food 1 hour prior to test.  You may take your regular medications prior to the test.  Take metoprolol (Lopressor) two hours prior to test. If you take Furosemide/Hydrochlorothiazide /Spironolactone/Chlorthalidone, please HOLD on the morning of the test. Patients who wear a continuous glucose monitor MUST remove the device prior to scanning. FEMALES- please wear underwire-free bra if available, avoid dresses & tight clothing        After the Test: Drink plenty of water. After receiving IV contrast, you may experience a mild flushed feeling. This is normal. On occasion, you may experience a mild rash up to 24 hours after the test. This is not dangerous. If this occurs, you can take Benadryl  25 mg, Zyrtec, Claritin, or Allegra and increase your fluid intake. (Patients taking Tikosyn should avoid Benadryl , and may take Zyrtec, Claritin, or Allegra) If you experience trouble breathing, this can be serious. If it is severe call 911 IMMEDIATELY. If it is mild, please call our office.  We will call to schedule your test 2-4 weeks out understanding that some insurance companies will need an authorization prior to the service being performed.   For more information and frequently asked questions, please visit our website : [email or web address]  For non-scheduling related questions, please contact the cardiac imaging nurse navigator should you have any questions/concerns: Cardiac Imaging Nurse Navigators Direct Office Dial: [phone]   For scheduling needs, including cancellations and rescheduling, please call Brittany, [phone].  Your physician recommends that you schedule a follow-up appointment in: 1 month  If you have any questions or concerns before your next appointment please send us  a message through [place] or call our office at [phone].    TO LEAVE A MESSAGE FOR THE NURSE SELECT OPTION 2, PLEASE LEAVE A MESSAGE INCLUDING: YOUR NAME DATE OF BIRTH CALL BACK NUMBER REASON FOR CALL**this is important as we prioritize the call backs  YOU WILL RECEIVE A CALL BACK THE SAME DAY AS LONG AS YOU CALL BEFORE 4:00 PM  At the [facility], you and your health needs are our priority. As part of our continuing mission to provide you with exceptional heart care, we have created designated Provider Care Teams. These Care Teams include your primary Cardiologist (physician) and Advanced  Practice Providers (APPs- Physician Assistants and Nurse Practitioners) who all work together to provide you with the care you need, when you need it.   You may see any of the following providers on your designated Care Team at your next follow up: Dr Toribio Fuel Dr [name] Dr. Morene Brownie Greig Mosses, NP Caffie Shed, [place] [facility], [place] Beckey Coe, NP Jordan Lee, NP Ellouise Class, NP Tinnie Redman, PharmD Jaun Bash, PharmD   Please be sure to bring in all your medications bottles to every appointment.    Thank you for choosing [facility]

## 2024-03-28 NOTE — Progress Notes (Signed)
 PCP: April Laymon PARAS, MD Cardiology: Dr. Rolan  Chief complaint: Chest pain  72 y.o. with history of hypertension, GERD, and hyperlipidemia was referred by Dr. Donah for evaluation of chest pain and PVCs.  Patient has a long history of severe GERD, improved with Protonix .  She will regurgitate at times.  For the last few weeks, she has been noting increasing episodes of chest tightness.  She is not sure if this is GERD or something different.  Tightness is substernal, there is no trigger (not brought on by exertion or meals).  Episodes will last for about a minute and resolve.  She will feel like she is choking.  History of anxiety in setting of husband's chronic illness, but this has actually been better recently.  She has also been feeling skipped beats in her chest. No lightheadedness or syncope.  ECG yesterday at PCP's office showed PVCs.  Patient is a nonsmoker, no FH of CAD.    ECG (personally reviewed): NSR, normal (no PVCs)  Labs (11/25): LDL 78, K 4.6, creatinine 9.21, TSH normal  PMH: 1. Hyperlipidemia 2. HTN 3. GERD: has been severe.  4. Anxiety 5. PVCs  SH: Married, lives in [place], nonsmoker, no ETOH.   FH: No premature CAD, no cardiomyopathy.   ROS: All systems reviewed and negative except as per HPI.   Current Outpatient Medications  Medication Sig Dispense Refill   amLODipine  (NORVASC ) 10 MG tablet TAKE 1 TABLET BY MOUTH EVERY DAY 90 tablet 3   Azelastine  HCl 0.15 % SOLN Place 2 sprays into both nostrils 2 (two) times daily. 23 mL 1   calcium  carbonate (CALCIUM  600) 600 MG TABS tablet Take 1 tablet (600 mg total) by mouth 2 (two) times daily with a meal. 60 tablet 1   Cholecalciferol (VITAMIN D3) 2000 units TABS Take 1 tablet by mouth daily. 30 tablet 2   escitalopram  (LEXAPRO ) 10 MG tablet Take 1 tablet (10 mg total) by mouth daily. 90 tablet 3   famotidine  (PEPCID ) 20 MG tablet TAKE 1 TABLET BY MOUTH TWICE A DAY 180 tablet 3   fexofenadine (ALLEGRA)  180 MG tablet Take 180 mg by mouth daily as needed.     losartan -hydrochlorothiazide  (HYZAAR) 50-12.5 MG tablet Take 1 tablet by mouth daily. 90 tablet 3   pantoprazole  (PROTONIX ) 40 MG tablet TAKE 1 TABLET BY MOUTH TWICE A DAY AS NEEDED 180 tablet 3   rosuvastatin  (CRESTOR ) 10 MG tablet TAKE 1 TABLET BY MOUTH EVERY DAY. 90 tablet 3   No current facility-administered medications for this encounter.   BP (!) 146/80   Pulse 65   Wt 68.7 kg (151 lb 6.4 oz)   SpO2 96%   BMI 26.82 kg/m  General: NAD Neck: No JVD, no thyromegaly or thyroid  nodule.  Lungs: Clear to auscultation bilaterally with normal respiratory effort. CV: Nondisplaced PMI.  Heart regular S1/S2, no S3/S4, no murmur.  No peripheral edema.  No carotid bruit.  Normal pedal pulses.  Abdomen: Soft, nontender, no hepatosplenomegaly, no distention.  Skin: Intact without lesions or rashes.  Neurologic: Alert and oriented x 3.  Psych: Normal affect. Extremities: No clubbing or cyanosis.  HEENT: Normal.   Assessment/Plan: 1. Chest pain: Atypical.  Has extensive GERD history, may be GI-related pain.  However, patient is also having PVCs and does have coronary risk factors (HTN, hyperlipidemia).   - I will arrange for coronary CTA to assess for significant CAD as cause of symptoms.  2. PVCs: Patient has palpitations and PVCs noted  on ECG yesterday though not today.  - I will have her wear a 1 week Zio monitor to assess for PVC frequency as well as NSVT/VT runs.  - I will arrange for echo to make sure heart is structurally normal.  3. HTN: BP mildly elevated today.  She will check BP daily x 2 wks and get the readings to me to decide on need to increase antihypertensive regimen.  4. Hyperlipidemia: Good lipids in 11/25.   Followup in 1 month.   I spent 56 minutes reviewing records, interviewing/examining patient, and managing orders.   [name] 03/28/2024

## 2024-03-28 NOTE — Assessment & Plan Note (Signed)
 Well-managed on Lexapro  10 mg daily. Prefers to continue medication to avoid depressive episodes.

## 2024-03-28 NOTE — Assessment & Plan Note (Signed)
 Update lipids, continue statin

## 2024-03-29 ENCOUNTER — Encounter: Payer: Self-pay | Admitting: Gastroenterology

## 2024-03-29 ENCOUNTER — Ambulatory Visit: Admitting: Gastroenterology

## 2024-03-29 VITALS — BP 112/74 | HR 65 | Ht 63.0 in | Wt 150.1 lb

## 2024-03-29 DIAGNOSIS — K449 Diaphragmatic hernia without obstruction or gangrene: Secondary | ICD-10-CM | POA: Diagnosis not present

## 2024-03-29 DIAGNOSIS — K3184 Gastroparesis: Secondary | ICD-10-CM | POA: Diagnosis not present

## 2024-03-29 DIAGNOSIS — Z860100 Personal history of colon polyps, unspecified: Secondary | ICD-10-CM | POA: Diagnosis not present

## 2024-03-29 DIAGNOSIS — K219 Gastro-esophageal reflux disease without esophagitis: Secondary | ICD-10-CM

## 2024-03-29 NOTE — Progress Notes (Signed)
 Discussed the use of AI scribe software for clinical note transcription with the patient, who gave verbal consent to proceed.  HPI : [name] is a 73 year old female with hiatal hernia and gastroparesis who presents with recurrent reflux symptoms.  She was last seen in our office last June at which time she was being considered for hiatal hernia repair and fundoplication, but not felt to be a good candidate due to an abnormal gastric emptying study consistent with gastroparesis.  Since then, she made significant dietary improvements and her symptoms improved to the point where she no longer needed to take any acid-reducing medications.      However, approximately three months ago, she experienced a major flare-up of reflux symptoms, including severe vomiting, particularly at night.  She restarted her medications, pantoprazole  and famotidine , taking each twice daily, which helped to alleviate her symptoms. Since restarting the medications, she has had three episodes of vomiting, but none in the past several weeks. Her symptoms are worse at night, and overeating exacerbates her condition. She has been managing her diet to prevent symptoms, and admits that her exacerbation of symptoms was likely related to dietary indiscretions.  She also experiences palpitations, identified as premature ventricular contractions (PVCs). She is currently wearing a heart monitor and has undergone cardiac testing, including a coronary CT and an echocardiogram, to further investigate these symptoms.  Her weight has been stable around 145-150 pounds, although she notes difficulty losing weight and a history of gaining 20 pounds over the years.      Endoscopic History: - 04/23/2019: EGD: Small hiatal hernia, gastritis (images reviewed and appears to have Hill grade 3 valve on retroflexion) - 08/26/2022: EGD: Moderately tortuous lower esophagus with mild inflammatory changes, Hill grade 4 valve, 3 cm HH.  Normal  stomach and duodenum  Gastric Emptying Study November 22, 2022 FINDINGS: Expected location of the stomach in the left upper quadrant. Ingested meal empties the stomach gradually over the course of the study.   21% emptied at 1 hr ( normal >= 10%)   27% emptied at 2 hr ( normal >= 40%)   42% emptied at 3 hr ( normal >= 70%)   61% emptied at 4 hr ( normal >= 90%)   IMPRESSION: Scintigraphic findings compatible with delayed gastric emptying.  Esophagram November 19, 2022 IMPRESSION: 1. Mild esophageal dysmotility with tertiary contractions. 2. Small hiatal hernia. 3. Large-volume gastroesophageal reflux observed to the level of the upper esophagus with the water siphon maneuver.  Past Medical History:  Diagnosis Date   Colon polyp    Gallstones    GERD (gastroesophageal reflux disease)    High blood pressure    Hyperlipidemia    Postmenopausal hormone replacement therapy      Past Surgical History:  Procedure Laterality Date   CHOLECYSTECTOMY N/A 07/05/2017   Procedure: LAPAROSCOPIC CHOLECYSTECTOMY;  Surgeon: Vernetta Berg, MD;  Location: [facility] OR;  Service: General;  Laterality: N/A;   SHOULDER ARTHROSCOPY Left 1990s   frozen shoulder   TIBIA FRACTURE SURGERY  2009   rod placed    TONSILLECTOMY     TOTAL ABDOMINAL HYSTERECTOMY N/A ~ 2000   TUBAL LIGATION N/A ~ 1995   Family History  Problem Relation Age of Onset   Thyroid  disease Mother    Osteoporosis Mother    Prostate cancer Father    Colon cancer Neg Hx    Esophageal cancer Neg Hx    Rectal cancer Neg Hx    Social  History   Tobacco Use   Smoking status: Never   Smokeless tobacco: Never  Vaping Use   Vaping status: Never Used  Substance Use Topics   Alcohol use: No   Drug use: No   Current Outpatient Medications  Medication Sig Dispense Refill   amLODipine  (NORVASC ) 10 MG tablet TAKE 1 TABLET BY MOUTH EVERY DAY 90 tablet 3   Azelastine  HCl 0.15 % SOLN Place 2 sprays into both nostrils 2 (two) times  daily. 23 mL 1   calcium  carbonate (CALCIUM  600) 600 MG TABS tablet Take 1 tablet (600 mg total) by mouth 2 (two) times daily with a meal. 60 tablet 1   Cholecalciferol (VITAMIN D3) 2000 units TABS Take 1 tablet by mouth daily. 30 tablet 2   escitalopram  (LEXAPRO ) 10 MG tablet Take 1 tablet (10 mg total) by mouth daily. 90 tablet 3   famotidine  (PEPCID ) 20 MG tablet TAKE 1 TABLET BY MOUTH TWICE A DAY 180 tablet 3   fexofenadine (ALLEGRA) 180 MG tablet Take 180 mg by mouth daily as needed.     losartan -hydrochlorothiazide  (HYZAAR) 50-12.5 MG tablet Take 1 tablet by mouth daily. 90 tablet 3   pantoprazole  (PROTONIX ) 40 MG tablet TAKE 1 TABLET BY MOUTH TWICE A DAY AS NEEDED 180 tablet 3   rosuvastatin  (CRESTOR ) 10 MG tablet TAKE 1 TABLET BY MOUTH EVERY DAY. 90 tablet 3   No current facility-administered medications for this visit.   No Known Allergies   Review of Systems: All systems reviewed and negative except where noted in HPI.    No results found.  Physical Exam: BP 112/74   Pulse 65   Ht 5' 3 (1.6 m)   Wt 150 lb 2 oz (68.1 kg)   BMI 26.59 kg/m  Constitutional: Pleasant,well-developed, Caucasian female in no acute distress. HEENT: Normocephalic and atraumatic. Conjunctivae are normal. No scleral icterus. Neurological: Alert and oriented to person place and time. Skin: Skin is warm and dry. No rashes noted. Psychiatric: Normal mood and affect. Behavior is normal.  CBC    Component Value Date/Time   WBC 6.5 05/11/2022 0551   RBC 4.97 05/11/2022 0551   HGB 14.2 05/11/2022 0551   HGB 14.9 02/15/2022 1518   HCT 42.1 05/11/2022 0551   HCT 44.0 02/15/2022 1518   PLT 320 05/11/2022 0551   PLT 327 02/15/2022 1518   MCV 84.7 05/11/2022 0551   MCV 85 02/15/2022 1518   MCH 28.6 05/11/2022 0551   MCHC 33.7 05/11/2022 0551   RDW 13.3 05/11/2022 0551   RDW 13.1 02/15/2022 1518    CMP     Component Value Date/Time   NA 140 03/26/2024 1008   K 4.6 03/26/2024 1008   CL 102  03/26/2024 1008   CO2 21 03/26/2024 1008   GLUCOSE 94 03/26/2024 1008   GLUCOSE 135 (H) 05/11/2022 0551   BUN 15 03/26/2024 1008   CREATININE 0.78 03/26/2024 1008   CALCIUM  9.9 03/26/2024 1008   PROT 6.8 03/26/2024 1008   ALBUMIN 4.6 03/26/2024 1008   AST 22 03/26/2024 1008   ALT 42 (H) 03/26/2024 1008   ALKPHOS 77 03/26/2024 1008   BILITOT 0.5 03/26/2024 1008   GFRNONAA >60 05/11/2022 0551   GFRAA 76 12/20/2019 1255       Latest Ref Rng & Units 05/11/2022    5:51 AM 02/15/2022    3:18 PM 10/14/2017    9:31 AM  CBC EXTENDED  WBC 4.0 - 10.5 K/uL 6.5  7.0  6.9  RBC 3.87 - 5.11 MIL/uL 4.97  5.15  5.16   Hemoglobin 12.0 - 15.0 g/dL 85.7  85.0  84.8   HCT 36.0 - 46.0 % 42.1  44.0  44.9   Platelets 150 - 400 K/uL 320  327  314       ASSESSMENT AND PLAN:  73 year old female with history of GERD/hiatal hernia and gastroparesis, previously controlled with diet with recent recurrence of symptoms in the setting of dietary indiscretions.  Symptoms are controlled with again with resumption of aggressive acid suppression.  Gastroesophageal reflux disease with hiatal hernia and gastroparesis Reflux symptoms improved with current medication. Recent flare-ups noted. Gastroparesis complicates surgical options. Symptoms previously controlled with diet alone.  Anticipate symptoms will continue to be controlled with strict diet control.  Can likely de-escalate acid suppressive regimen. - Discontinue Pepcid , continue pantoprazole  40 mg twice daily. - Use Pepcid  as needed for symptom flare-ups. - If symptoms controlled, reduce pantoprazole  to 40 mg once daily after one month. - Monitor symptoms and adjust treatment as needed. - Follow up with cardiologist for palpitations and chest pain.  History of precancerous colonic polyp Last colonoscopy in 2019 showed precancerous polyp. Follow-up colonoscopy due in December 2026 per guidelines. - Schedule follow-up colonoscopy in December  2026.  Recording duration: 13 minutes     Mcclellan Demarais E. Stacia, MD [facility]   CC:  Donah Laymon PARAS, MD

## 2024-03-29 NOTE — Patient Instructions (Signed)
 Continue Protonix  40 mg twice daily and can try to decease to once a day in 2 weeks if your symptoms are controlled.   If your symptoms persist and get worse, please contact our office by phone or MyChart.  You will be due for a recall colonoscopy in 04/2025. We will send you a reminder in the mail when it gets closer to that time.  _______________________________________________________  If your blood pressure at your visit was 140/90 or greater, please contact your primary care physician to follow up on this.  _______________________________________________________  If you are age 54 or older, your body mass index should be between 23-30. Your Body mass index is 26.59 kg/m. If this is out of the aforementioned range listed, please consider follow up with your Primary Care Provider.  If you are age 71 or younger, your body mass index should be between 19-25. Your Body mass index is 26.59 kg/m. If this is out of the aformentioned range listed, please consider follow up with your Primary Care Provider.   ________________________________________________________  The [facility] providers would like to encourage you to use MYCHART to communicate with providers for non-urgent requests or questions.  Due to long hold times on the telephone, sending your provider a message by [facility] may be a faster and more efficient way to get a response.  Please allow 48 business hours for a response.  Please remember that this is for non-urgent requests.  _______________________________________________________  [facility] is using a team-based approach to care.  Your team is made up of your doctor and two to three APPS. Our APPS (Nurse Practitioners and Physician Assistants) work with your physician to ensure care continuity for you. They are fully qualified to address your health concerns and develop a treatment plan. They communicate directly with your gastroenterologist to care for you. Seeing  the Advanced Practice Practitioners on your physician's team can help you by facilitating care more promptly, often allowing for earlier appointments, access to diagnostic testing, procedures, and other specialty referrals.

## 2024-04-05 ENCOUNTER — Encounter (HOSPITAL_COMMUNITY): Payer: Self-pay

## 2024-04-06 NOTE — Progress Notes (Signed)
 [name]                                          MRN: [id number]   04/06/2024   The [facility] Quality Team Specialist reviewed this patient medical record for the purposes of chart review for care gap closure. The following were reviewed: abstraction for care gap closure-controlling blood pressure.    VBCI Quality Team

## 2024-04-09 ENCOUNTER — Ambulatory Visit (HOSPITAL_COMMUNITY): Payer: Self-pay | Admitting: Cardiology

## 2024-04-09 ENCOUNTER — Ambulatory Visit (HOSPITAL_COMMUNITY)
Admission: RE | Admit: 2024-04-09 | Discharge: 2024-04-09 | Disposition: A | Discharge status: Home / Self Care | Source: Ambulatory Visit | Attending: Cardiology | Admitting: Cardiology

## 2024-04-09 DIAGNOSIS — I428 Other cardiomyopathies: Secondary | ICD-10-CM | POA: Diagnosis not present

## 2024-04-09 DIAGNOSIS — I493 Ventricular premature depolarization: Secondary | ICD-10-CM | POA: Diagnosis not present

## 2024-04-09 MED ORDER — NITROGLYCERIN 0.4 MG SL SUBL
0.8000 mg | SUBLINGUAL_TABLET | Freq: Once | SUBLINGUAL | Status: AC
  Administered 2024-04-09: 0.8 mg via SUBLINGUAL

## 2024-04-09 MED ORDER — IOHEXOL 350 MG/ML SOLN
95.0000 mL | Freq: Once | INTRAVENOUS | Status: AC | PRN
  Administered 2024-04-09: 95 mL via INTRAVENOUS

## 2024-04-10 ENCOUNTER — Ambulatory Visit (HOSPITAL_COMMUNITY)
Admission: RE | Admit: 2024-04-10 | Discharge: 2024-04-10 | Disposition: A | Discharge status: Home / Self Care | Source: Ambulatory Visit | Attending: Cardiology | Admitting: Cardiology

## 2024-04-10 ENCOUNTER — Encounter (HOSPITAL_COMMUNITY): Payer: Self-pay | Admitting: Cardiology

## 2024-04-10 DIAGNOSIS — I10 Essential (primary) hypertension: Secondary | ICD-10-CM | POA: Diagnosis not present

## 2024-04-10 DIAGNOSIS — R079 Chest pain, unspecified: Secondary | ICD-10-CM

## 2024-04-10 DIAGNOSIS — E785 Hyperlipidemia, unspecified: Secondary | ICD-10-CM | POA: Diagnosis not present

## 2024-04-10 DIAGNOSIS — I428 Other cardiomyopathies: Secondary | ICD-10-CM | POA: Diagnosis not present

## 2024-04-10 LAB — ECHOCARDIOGRAM COMPLETE
AR max vel: 1.52 cm2
AV Area VTI: 1.45 cm2
AV Area mean vel: 1.45 cm2
AV Mean grad: 4 mmHg
AV Peak grad: 7.7 mmHg
Ao pk vel: 1.39 m/s
Area-P 1/2: 4.49 cm2
MV M vel: 0.92 m/s
MV Peak grad: 3.4 mmHg
S' Lateral: 2.6 cm

## 2024-04-10 MED ORDER — LOSARTAN POTASSIUM-HCTZ 100-12.5 MG PO TABS: 1.0000 | ORAL_TABLET | Freq: Every day | ORAL | 3 refills | Status: AC

## 2024-04-11 ENCOUNTER — Other Ambulatory Visit: Payer: Self-pay

## 2024-04-15 ENCOUNTER — Ambulatory Visit (HOSPITAL_COMMUNITY): Payer: Self-pay | Admitting: Cardiology

## 2024-04-23 ENCOUNTER — Ambulatory Visit (HOSPITAL_COMMUNITY)
Admission: RE | Admit: 2024-04-23 | Discharge: 2024-04-23 | Disposition: A | Source: Ambulatory Visit | Attending: Cardiology

## 2024-04-23 ENCOUNTER — Ambulatory Visit (HOSPITAL_COMMUNITY): Payer: Self-pay | Admitting: Cardiology

## 2024-04-23 DIAGNOSIS — I428 Other cardiomyopathies: Secondary | ICD-10-CM | POA: Diagnosis not present

## 2024-04-23 LAB — BASIC METABOLIC PANEL WITH GFR
Anion gap: 8 (ref 5–15)
BUN: 18 mg/dL (ref 8–23)
CO2: 29 mmol/L (ref 22–32)
Calcium: 9.1 mg/dL (ref 8.9–10.3)
Chloride: 103 mmol/L (ref 98–111)
Creatinine, Ser: 0.82 mg/dL (ref 0.44–1.00)
GFR, Estimated: >60 mL/min (ref >60)
Glucose, Bld: 117 mg/dL — ABNORMAL HIGH (ref 70–99)
Potassium: 3.9 mmol/L (ref 3.5–5.1)
Sodium: 140 mmol/L (ref 135–145)

## 2024-04-24 ENCOUNTER — Other Ambulatory Visit

## 2024-04-24 DIAGNOSIS — R7401 Elevation of levels of liver transaminase levels: Secondary | ICD-10-CM

## 2024-04-25 LAB — CMP14+EGFR
ALT: 39 IU/L — ABNORMAL HIGH (ref 0–32)
AST: 24 IU/L (ref 0–40)
Albumin: 4.7 g/dL (ref 3.8–4.8)
Alkaline Phosphatase: 89 IU/L (ref 49–135)
BUN/Creatinine Ratio: 17 (ref 12–28)
BUN: 15 mg/dL (ref 8–27)
Bilirubin Total: 0.5 mg/dL (ref 0.0–1.2)
CO2: 25 mmol/L (ref 20–29)
Calcium: 10.7 mg/dL — ABNORMAL HIGH (ref 8.7–10.3)
Chloride: 100 mmol/L (ref 96–106)
Creatinine, Ser: 0.89 mg/dL (ref 0.57–1.00)
Globulin, Total: 2.7 g/dL (ref 1.5–4.5)
Glucose: 104 mg/dL — ABNORMAL HIGH (ref 70–99)
Potassium: 4.6 mmol/L (ref 3.5–5.2)
Sodium: 140 mmol/L (ref 134–144)
Total Protein: 7.4 g/dL (ref 6.0–8.5)
eGFR: 69 mL/min/1.73 (ref >59)

## 2024-04-26 ENCOUNTER — Ambulatory Visit: Payer: Self-pay | Admitting: Family Medicine

## 2024-04-27 ENCOUNTER — Encounter (HOSPITAL_COMMUNITY): Payer: Self-pay | Admitting: Cardiology

## 2024-04-27 ENCOUNTER — Ambulatory Visit (HOSPITAL_COMMUNITY)
Admission: RE | Admit: 2024-04-27 | Discharge: 2024-04-27 | Disposition: A | Source: Ambulatory Visit | Attending: Cardiology | Admitting: Cardiology

## 2024-04-27 VITALS — BP 140/82 | HR 67 | Ht 63.0 in | Wt 150.4 lb

## 2024-04-27 DIAGNOSIS — R079 Chest pain, unspecified: Secondary | ICD-10-CM | POA: Diagnosis not present

## 2024-04-27 DIAGNOSIS — E785 Hyperlipidemia, unspecified: Secondary | ICD-10-CM | POA: Diagnosis not present

## 2024-04-27 DIAGNOSIS — Z79899 Other long term (current) drug therapy: Secondary | ICD-10-CM | POA: Diagnosis not present

## 2024-04-27 DIAGNOSIS — K3184 Gastroparesis: Secondary | ICD-10-CM | POA: Diagnosis not present

## 2024-04-27 DIAGNOSIS — I428 Other cardiomyopathies: Secondary | ICD-10-CM

## 2024-04-27 DIAGNOSIS — I10 Essential (primary) hypertension: Secondary | ICD-10-CM | POA: Diagnosis not present

## 2024-04-27 DIAGNOSIS — R0789 Other chest pain: Secondary | ICD-10-CM | POA: Diagnosis not present

## 2024-04-27 DIAGNOSIS — I493 Ventricular premature depolarization: Secondary | ICD-10-CM | POA: Diagnosis not present

## 2024-04-27 DIAGNOSIS — K219 Gastro-esophageal reflux disease without esophagitis: Secondary | ICD-10-CM | POA: Diagnosis not present

## 2024-04-27 NOTE — Progress Notes (Signed)
 PCP: Donah Laymon PARAS, MD Cardiology: Dr. Rolan  Chief complaint: Chest pain  73 y.o. with history of hypertension, GERD, and hyperlipidemia was referred by Dr. Donah for evaluation of chest pain and PVCs.  Patient has a long history of severe GERD, improved with Protonix .  She will regurgitate at times.  Prior to initial visit, she had been noting increasing episodes of chest tightness.  She was not sure if this was GERD or something different.  Tightness was substernal, there was no trigger (not brought on by exertion or meals).  She has also had been feeling skipped beats in her chest. No lightheadedness or syncope.  ECG at PCP's office showed PVCs.  Patient is a nonsmoker, no FH of CAD.    We did a coronary CTA in 11/25 which showed CAC 21.9 Agatston units (51st percentile), minimal nonobstructive CAD.  Echo in 11/25 showed EF 55-60% with normal RV.  Zio monitor in 11/25 showed no significant arrhythmia.   Patient returns for followup of chest pain and cardiac risk.  BP mildly elevated today but SBP generally 120s on her home readings (brings them in today).  Her chest pain is now much improved with GI medication adjustment; she thinks that symptoms were primarily from GERD and gastroparesis. No exertional dyspnea or chest pain.    Labs (11/25): LDL 78, K 4.6, creatinine 9.21, TSH normal Labs (12/25): K 4.6, creatinine 0.89, ALT 39, AST 24  PMH: 1. Hyperlipidemia 2. HTN 3. GERD: has been severe.  4. Anxiety 5. PVCs - Zio monitor (11/25): No significant arrhythmias.  6. Chest pain: - Coronary CTA (11/25): CAC 21.9 Agatston units (51st percentile), minimal nonobstructive CAD.  - Echo (11/25): EF 55-60%, normal RV.   SH: Married, lives in [place], nonsmoker, no ETOH.   FH: No premature CAD, no cardiomyopathy.   ROS: All systems reviewed and negative except as per HPI.   Current Outpatient Medications  Medication Sig Dispense Refill   amLODipine  (NORVASC ) 10 MG tablet  TAKE 1 TABLET BY MOUTH EVERY DAY 90 tablet 3   Azelastine  HCl 0.15 % SOLN Place 2 sprays into both nostrils 2 (two) times daily. 23 mL 1   calcium  carbonate (CALCIUM  600) 600 MG TABS tablet Take 1 tablet (600 mg total) by mouth 2 (two) times daily with a meal. 60 tablet 1   Cholecalciferol (VITAMIN D3) 2000 units TABS Take 1 tablet by mouth daily. 30 tablet 2   escitalopram  (LEXAPRO ) 10 MG tablet Take 1 tablet (10 mg total) by mouth daily. 90 tablet 3   famotidine  (PEPCID ) 20 MG tablet TAKE 1 TABLET BY MOUTH TWICE A DAY 180 tablet 3   fexofenadine (ALLEGRA) 180 MG tablet Take 180 mg by mouth daily as needed.     losartan -hydrochlorothiazide  (HYZAAR) 100-12.5 MG tablet Take 1 tablet by mouth daily. 90 tablet 3   pantoprazole  (PROTONIX ) 40 MG tablet TAKE 1 TABLET BY MOUTH TWICE A DAY AS NEEDED 180 tablet 3   rosuvastatin  (CRESTOR ) 10 MG tablet TAKE 1 TABLET BY MOUTH EVERY DAY. 90 tablet 3   No current facility-administered medications for this encounter.   BP (!) 140/82   Pulse 67   Ht 5' 3 (1.6 m)   Wt 68.2 kg (150 lb 6.4 oz)   SpO2 98%   BMI 26.64 kg/m  General: NAD Neck: No JVD, no thyromegaly or thyroid  nodule.  Lungs: Clear to auscultation bilaterally with normal respiratory effort. CV: Nondisplaced PMI.  Heart regular S1/S2, no S3/S4, no murmur.  No peripheral edema.  No carotid bruit.  Normal pedal pulses.  Abdomen: Soft, nontender, no hepatosplenomegaly, no distention.  Skin: Intact without lesions or rashes.  Neurologic: Alert and oriented x 3.  Psych: Normal affect. Extremities: No clubbing or cyanosis.  HEENT: Normal.   Assessment/Plan: 1. Chest pain: Atypical.  I suspect this was related to significant GERD and gastroparesis.  Coronary CTA showed only minimal CAD and echo was normal.  Continue risk factor modification.  2. PVCs: Patient has had palpitations and PVCs noted on ECG at PCP's office.  ZIo monitor in 11/25 showed no significant arrhythmias and palpitations have  resolved.  3. HTN: SBP generally 120s at ome.  4. Hyperlipidemia: Good lipids in 11/25. ALT was slightly elevated on CMET in 12/25, PCP following.   Followup prn  I spent 21 minutes reviewing records, interviewing/examining patient, and managing orders.   [name] 04/27/2024

## 2024-04-27 NOTE — Patient Instructions (Signed)
 There has been no changes to your medications.  Your physician recommends that you schedule a follow-up appointment in: as needed.  If you have any questions or concerns before your next appointment please send us  a message through [place] or call our office at [phone].    TO LEAVE A MESSAGE FOR THE NURSE SELECT OPTION 2, PLEASE LEAVE A MESSAGE INCLUDING: YOUR NAME DATE OF BIRTH CALL BACK NUMBER REASON FOR CALL**this is important as we prioritize the call backs  YOU WILL RECEIVE A CALL BACK THE SAME DAY AS LONG AS YOU CALL BEFORE 4:00 PM  At the [facility], you and your health needs are our priority. As part of our continuing mission to provide you with exceptional heart care, we have created designated Provider Care Teams. These Care Teams include your primary Cardiologist (physician) and Advanced Practice Providers (APPs- Physician Assistants and Nurse Practitioners) who all work together to provide you with the care you need, when you need it.   You may see any of the following providers on your designated Care Team at your next follow up: Dr Toribio Fuel Dr [name] Dr. Morene Brownie Greig Mosses, NP Caffie Shed, [place] [facility] [place], [place] Beckey Coe, NP Jordan Lee, NP Ellouise Class, NP Tinnie Redman, PharmD Jaun Bash, PharmD   Please be sure to bring in all your medications bottles to every appointment.    Thank you for choosing [facility]

## 2024-05-08 ENCOUNTER — Encounter: Payer: Self-pay | Admitting: Family Medicine

## 2024-05-08 ENCOUNTER — Ambulatory Visit: Admitting: Family Medicine

## 2024-05-08 VITALS — BP 120/68 | HR 77 | Ht 63.0 in | Wt 149.0 lb

## 2024-05-08 DIAGNOSIS — R7401 Elevation of levels of liver transaminase levels: Secondary | ICD-10-CM | POA: Diagnosis not present

## 2024-05-08 NOTE — Progress Notes (Unsigned)
°  Date of Visit: 05/08/2024   SUBJECTIVE:   HPI:  Discussed the use of AI scribe software for clinical note transcription with the patient, who gave verbal consent to proceed.  History of Present Illness [name] is a 73 year old female who presents for follow-up on elevated liver enzymes and blood sugar levels.  Elevated liver enzymes - ALT levels have been slightly elevated over the past few tests: 37, 42, and 39 U/L (upper limit of normal: 32 U/L). - She is asymptomatic  Saw Dr. Rolan who completed cardiac workup, all was well, no abnormalities      OBJECTIVE:   BP 120/68   Pulse 77   Ht 5' 3 (1.6 m)   Wt 149 lb (67.6 kg)   SpO2 97%   BMI 26.39 kg/m  Gen: no acute distress pleasant cooperative HEENT: normocephalic, atraumatic  Heart: regular rate and rhythm, no murmur Lungs: clear to auscultation bilaterally, normal work of breathing  Abdomen: soft, nontender to palpation, no masses or organomegaly, no peritoneal signs Neuro: alert, grossly nonfocal, speech normal  ASSESSMENT/PLAN:   Assessment & Plan Elevated ALT measurement No significant liver disease symptoms. ALT mildly elevated. Suspect likely MASH, though worth ruling out viral hepatitis and hemachromatosis. - viral hepatitis panel - iron studies - RUQ U/S - repeat LFTs today   Arlind Klingerman J. Donah, MD [facility]

## 2024-05-09 ENCOUNTER — Ambulatory Visit: Payer: Self-pay | Admitting: Family Medicine

## 2024-05-09 LAB — CMP14+EGFR
ALT: 35 IU/L — ABNORMAL HIGH (ref 0–32)
AST: 21 IU/L (ref 0–40)
Albumin: 4.4 g/dL (ref 3.8–4.8)
Alkaline Phosphatase: 86 IU/L (ref 49–135)
BUN/Creatinine Ratio: 17 (ref 12–28)
BUN: 14 mg/dL (ref 8–27)
Bilirubin Total: 0.4 mg/dL (ref 0.0–1.2)
CO2: 23 mmol/L (ref 20–29)
Calcium: 9.9 mg/dL (ref 8.7–10.3)
Chloride: 102 mmol/L (ref 96–106)
Creatinine, Ser: 0.84 mg/dL (ref 0.57–1.00)
Globulin, Total: 2.1 g/dL (ref 1.5–4.5)
Glucose: 110 mg/dL — ABNORMAL HIGH (ref 70–99)
Potassium: 4.2 mmol/L (ref 3.5–5.2)
Sodium: 139 mmol/L (ref 134–144)
Total Protein: 6.5 g/dL (ref 6.0–8.5)
eGFR: 74 mL/min/1.73 (ref >59)

## 2024-05-09 LAB — IRON,TIBC AND FERRITIN PANEL
Ferritin: 74 ng/mL (ref 15–150)
Iron Saturation: 26 % (ref 15–55)
Iron: 87 ug/dL (ref 27–139)
Total Iron Binding Capacity: 339 ug/dL (ref 250–450)
UIBC: 252 ug/dL (ref 118–369)

## 2024-05-09 LAB — ACUTE HEP PANEL AND HEP B SURFACE AB
Hep A IgM: NEGATIVE
Hep B C IgM: NEGATIVE
Hep C Virus Ab: NONREACTIVE
Hepatitis B Surf Ab Quant: 58.6 m[IU]/mL
Hepatitis B Surface Ag: NEGATIVE

## 2024-05-10 NOTE — Progress Notes (Signed)
 [name]                                          MRN: [id number]   05/10/2024   The VBCI Quality Team Specialist reviewed this patient medical record for the purposes of chart review for care gap closure. The following were reviewed: abstraction for care gap closure-controlling blood pressure.    VBCI Quality Team

## 2024-05-12 ENCOUNTER — Ambulatory Visit (HOSPITAL_COMMUNITY)
Admission: RE | Admit: 2024-05-12 | Discharge: 2024-05-12 | Disposition: A | Discharge status: Home / Self Care | Source: Ambulatory Visit | Attending: Family Medicine | Admitting: Family Medicine

## 2024-05-12 DIAGNOSIS — R7401 Elevation of levels of liver transaminase levels: Secondary | ICD-10-CM | POA: Insufficient documentation

## 2024-05-12 DIAGNOSIS — K760 Fatty (change of) liver, not elsewhere classified: Secondary | ICD-10-CM | POA: Diagnosis not present

## 2024-05-12 DIAGNOSIS — Z9049 Acquired absence of other specified parts of digestive tract: Secondary | ICD-10-CM | POA: Diagnosis not present

## 2024-05-30 LAB — HM MAMMOGRAPHY

## 2024-05-31 ENCOUNTER — Encounter: Payer: Self-pay | Admitting: Family Medicine

## 2024-06-23 ENCOUNTER — Other Ambulatory Visit: Payer: Self-pay | Admitting: Family Medicine

## 2024-06-23 DIAGNOSIS — F439 Reaction to severe stress, unspecified: Secondary | ICD-10-CM
